# Patient Record
Sex: Female | Born: 1975 | Race: White | Hispanic: Yes | Smoking: Never smoker
Health system: Southern US, Community
[De-identification: ages and names within clinical notes are randomized; demographics above are authoritative.]

## PROBLEM LIST (undated history)

## (undated) HISTORY — PX: IMPACTED THIRD MOLAR REMOVAL: SHX1790

---

## 1997-10-30 ENCOUNTER — Emergency Department (HOSPITAL_COMMUNITY): Admission: EM | Admit: 1997-10-30 | Discharge: 1997-10-30 | Payer: Self-pay | Admitting: Emergency Medicine

## 1997-10-31 ENCOUNTER — Emergency Department (HOSPITAL_COMMUNITY): Admission: EM | Admit: 1997-10-31 | Discharge: 1997-10-31 | Payer: Self-pay | Admitting: *Deleted

## 1997-10-31 ENCOUNTER — Inpatient Hospital Stay (HOSPITAL_COMMUNITY): Admission: AD | Admit: 1997-10-31 | Discharge: 1997-11-03 | Payer: Self-pay | Admitting: Obstetrics

## 1997-11-08 ENCOUNTER — Encounter: Admission: RE | Admit: 1997-11-08 | Discharge: 1998-02-06 | Payer: Self-pay | Admitting: Obstetrics

## 1997-12-06 ENCOUNTER — Ambulatory Visit (HOSPITAL_COMMUNITY): Admission: RE | Admit: 1997-12-06 | Discharge: 1997-12-06 | Payer: Self-pay | Admitting: Obstetrics

## 1998-02-13 ENCOUNTER — Encounter: Admission: RE | Admit: 1998-02-13 | Discharge: 1998-05-14 | Payer: Self-pay | Admitting: Obstetrics & Gynecology

## 1998-02-27 ENCOUNTER — Ambulatory Visit (HOSPITAL_COMMUNITY): Admission: RE | Admit: 1998-02-27 | Discharge: 1998-02-27 | Payer: Self-pay | Admitting: Obstetrics & Gynecology

## 1998-04-17 ENCOUNTER — Encounter: Admission: RE | Admit: 1998-04-17 | Discharge: 1998-04-17 | Payer: Self-pay | Admitting: Obstetrics & Gynecology

## 1998-04-24 ENCOUNTER — Encounter: Admission: RE | Admit: 1998-04-24 | Discharge: 1998-04-24 | Payer: Self-pay | Admitting: Obstetrics & Gynecology

## 1998-05-03 ENCOUNTER — Inpatient Hospital Stay (HOSPITAL_COMMUNITY): Admission: AD | Admit: 1998-05-03 | Discharge: 1998-05-05 | Payer: Self-pay | Admitting: *Deleted

## 1998-07-14 ENCOUNTER — Emergency Department (HOSPITAL_COMMUNITY): Admission: EM | Admit: 1998-07-14 | Discharge: 1998-07-14 | Payer: Self-pay | Admitting: Emergency Medicine

## 1998-07-14 ENCOUNTER — Encounter: Payer: Self-pay | Admitting: Emergency Medicine

## 1998-11-15 ENCOUNTER — Other Ambulatory Visit: Admission: RE | Admit: 1998-11-15 | Discharge: 1998-11-15 | Payer: Self-pay | Admitting: Obstetrics

## 1998-11-18 ENCOUNTER — Other Ambulatory Visit: Admission: RE | Admit: 1998-11-18 | Discharge: 1998-11-18 | Payer: Self-pay | Admitting: Obstetrics

## 2002-08-13 ENCOUNTER — Inpatient Hospital Stay (HOSPITAL_COMMUNITY): Admission: AD | Admit: 2002-08-13 | Discharge: 2002-08-15 | Payer: Self-pay | Admitting: Obstetrics

## 2006-04-22 ENCOUNTER — Ambulatory Visit (HOSPITAL_COMMUNITY): Admission: RE | Admit: 2006-04-22 | Discharge: 2006-04-22 | Payer: Self-pay | Admitting: Chiropractic Medicine

## 2007-11-29 ENCOUNTER — Inpatient Hospital Stay (HOSPITAL_COMMUNITY): Admission: AD | Admit: 2007-11-29 | Discharge: 2007-11-29 | Payer: Self-pay | Admitting: Obstetrics & Gynecology

## 2007-12-17 ENCOUNTER — Inpatient Hospital Stay (HOSPITAL_COMMUNITY): Admission: AD | Admit: 2007-12-17 | Discharge: 2007-12-17 | Payer: Self-pay | Admitting: Obstetrics & Gynecology

## 2008-04-16 ENCOUNTER — Inpatient Hospital Stay (HOSPITAL_COMMUNITY): Admission: AD | Admit: 2008-04-16 | Discharge: 2008-04-17 | Payer: Self-pay | Admitting: Family Medicine

## 2008-04-27 ENCOUNTER — Inpatient Hospital Stay (HOSPITAL_COMMUNITY): Admission: AD | Admit: 2008-04-27 | Discharge: 2008-04-27 | Payer: Self-pay | Admitting: Obstetrics & Gynecology

## 2008-05-09 ENCOUNTER — Ambulatory Visit: Payer: Self-pay | Admitting: Obstetrics & Gynecology

## 2008-05-09 ENCOUNTER — Encounter: Payer: Self-pay | Admitting: Physician Assistant

## 2008-05-09 ENCOUNTER — Encounter: Payer: Self-pay | Admitting: Obstetrics and Gynecology

## 2008-05-09 LAB — CONVERTED CEMR LAB
Hgb A2 Quant: 2.5 % (ref 2.2–3.2)
Hgb A: 97.5 % (ref 96.8–97.8)
Hgb S Quant: 0 % (ref 0.0–0.0)

## 2008-05-18 ENCOUNTER — Ambulatory Visit: Payer: Self-pay | Admitting: Physician Assistant

## 2008-05-18 ENCOUNTER — Inpatient Hospital Stay (HOSPITAL_COMMUNITY): Admission: AD | Admit: 2008-05-18 | Discharge: 2008-05-18 | Payer: Self-pay | Admitting: Family Medicine

## 2008-05-21 ENCOUNTER — Inpatient Hospital Stay (HOSPITAL_COMMUNITY): Admission: AD | Admit: 2008-05-21 | Discharge: 2008-05-23 | Payer: Self-pay | Admitting: Family Medicine

## 2008-05-21 ENCOUNTER — Ambulatory Visit: Payer: Self-pay | Admitting: Obstetrics and Gynecology

## 2008-10-30 ENCOUNTER — Emergency Department (HOSPITAL_COMMUNITY): Admission: EM | Admit: 2008-10-30 | Discharge: 2008-10-30 | Payer: Self-pay | Admitting: Emergency Medicine

## 2010-10-15 LAB — POCT PREGNANCY, URINE: Preg Test, Ur: NEGATIVE

## 2010-10-15 LAB — POCT CARDIAC MARKERS
CKMB, poc: <1 ng/mL — ABNORMAL LOW (ref 1.0–8.0)
Troponin i, poc: <0.05 ng/mL (ref 0.00–0.09)

## 2010-10-15 LAB — HEPATIC FUNCTION PANEL
Albumin: 3.9 g/dL (ref 3.5–5.2)
Alkaline Phosphatase: 130 U/L — ABNORMAL HIGH (ref 39–117)
Bilirubin, Direct: <0.1 mg/dL (ref 0.0–0.3)

## 2010-10-15 LAB — POCT I-STAT, CHEM 8
BUN: 7 mg/dL (ref 6–23)
Calcium, Ion: 1.18 mmol/L (ref 1.12–1.32)
Chloride: 105 mEq/L (ref 96–112)
Glucose, Bld: 111 mg/dL — ABNORMAL HIGH (ref 70–99)
HCT: 45 % (ref 36.0–46.0)
Potassium: 3.5 mEq/L (ref 3.5–5.1)
Sodium: 140 mEq/L (ref 135–145)

## 2010-10-15 LAB — URINALYSIS, ROUTINE W REFLEX MICROSCOPIC: Bilirubin Urine: NEGATIVE

## 2010-10-15 LAB — D-DIMER, QUANTITATIVE: D-Dimer, Quant: <0.22 ug/mL-FEU (ref 0.00–0.48)

## 2010-10-15 LAB — URINE MICROSCOPIC-ADD ON

## 2011-04-01 LAB — URINALYSIS, ROUTINE W REFLEX MICROSCOPIC
Bilirubin Urine: NEGATIVE
Hgb urine dipstick: NEGATIVE
Ketones, ur: NEGATIVE
Protein, ur: NEGATIVE
Specific Gravity, Urine: <1.005 — ABNORMAL LOW
pH: 6

## 2011-04-01 LAB — WET PREP, GENITAL
Trich, Wet Prep: NONE SEEN
Yeast Wet Prep HPF POC: NONE SEEN

## 2011-04-01 LAB — GC/CHLAMYDIA PROBE AMP, GENITAL: Chlamydia, DNA Probe: NEGATIVE

## 2011-04-02 LAB — URINALYSIS, ROUTINE W REFLEX MICROSCOPIC
Specific Gravity, Urine: 1.01
Urobilinogen, UA: 0.2

## 2011-04-06 LAB — CBC
Hemoglobin: 11.8 — ABNORMAL LOW
MCV: 87.4
Platelets: 161
WBC: 10.4

## 2011-04-06 LAB — TYPE AND SCREEN: ABO/RH(D): O POS

## 2011-04-06 LAB — DIFFERENTIAL
Basophils Absolute: 0
Basophils Relative: 0
Eosinophils Absolute: 0.1
Eosinophils Relative: 1
Monocytes Absolute: 0.7
Monocytes Relative: 6
Neutro Abs: 7.5
Neutrophils Relative %: 73

## 2011-04-06 LAB — ABO/RH: ABO/RH(D): O POS

## 2011-04-06 LAB — HEPATITIS B SURFACE ANTIGEN: Hepatitis B Surface Ag: NEGATIVE

## 2011-04-06 LAB — RUBELLA SCREEN: Rubella: 168.9 — ABNORMAL HIGH

## 2011-04-07 LAB — CBC
HCT: 38.3
Platelets: 148 — ABNORMAL LOW
RBC: 3.52 — ABNORMAL LOW
RDW: 15
WBC: 13.4 — ABNORMAL HIGH
WBC: 13.9 — ABNORMAL HIGH

## 2011-04-07 LAB — POCT URINALYSIS DIP (DEVICE)
Hgb urine dipstick: NEGATIVE
Protein, ur: NEGATIVE
Specific Gravity, Urine: 1.01
Urobilinogen, UA: 0.2
pH: 5

## 2011-04-07 LAB — RAPID HIV SCREEN (WH-MAU): Rapid HIV Screen: NONREACTIVE

## 2011-04-07 LAB — GLUCOSE, CAPILLARY: Glucose-Capillary: 113 — ABNORMAL HIGH

## 2011-08-27 ENCOUNTER — Emergency Department (HOSPITAL_COMMUNITY)
Admission: EM | Admit: 2011-08-27 | Discharge: 2011-08-27 | Disposition: A | Discharge status: Home / Self Care | Payer: Self-pay | Attending: Emergency Medicine | Admitting: Emergency Medicine

## 2011-08-27 ENCOUNTER — Encounter (HOSPITAL_COMMUNITY): Payer: Self-pay | Admitting: Emergency Medicine

## 2011-08-27 DIAGNOSIS — B349 Viral infection, unspecified: Secondary | ICD-10-CM

## 2011-08-27 DIAGNOSIS — B9789 Other viral agents as the cause of diseases classified elsewhere: Secondary | ICD-10-CM | POA: Insufficient documentation

## 2011-08-27 DIAGNOSIS — R109 Unspecified abdominal pain: Secondary | ICD-10-CM | POA: Insufficient documentation

## 2011-08-27 DIAGNOSIS — R197 Diarrhea, unspecified: Secondary | ICD-10-CM | POA: Insufficient documentation

## 2011-08-27 DIAGNOSIS — R112 Nausea with vomiting, unspecified: Secondary | ICD-10-CM | POA: Insufficient documentation

## 2011-08-27 LAB — CBC
HCT: 39.5 % (ref 36.0–46.0)
Hemoglobin: 14 g/dL (ref 12.0–15.0)
MCV: 86.2 fL (ref 78.0–100.0)
RBC: 4.58 MIL/uL (ref 3.87–5.11)
WBC: 13.7 10*3/uL — ABNORMAL HIGH (ref 4.0–10.5)

## 2011-08-27 LAB — LIPASE, BLOOD: Lipase: 22 U/L (ref 11–59)

## 2011-08-27 LAB — URINALYSIS, ROUTINE W REFLEX MICROSCOPIC
Bilirubin Urine: NEGATIVE
Glucose, UA: NEGATIVE mg/dL
Ketones, ur: NEGATIVE mg/dL
Leukocytes, UA: NEGATIVE
Protein, ur: NEGATIVE mg/dL
pH: 5.5 (ref 5.0–8.0)

## 2011-08-27 LAB — COMPREHENSIVE METABOLIC PANEL
AST: 36 U/L (ref 0–37)
Albumin: 4.3 g/dL (ref 3.5–5.2)
Alkaline Phosphatase: 115 U/L (ref 39–117)
BUN: 14 mg/dL (ref 6–23)
CO2: 23 mEq/L (ref 19–32)
Chloride: 100 mEq/L (ref 96–112)
Creatinine, Ser: 0.53 mg/dL (ref 0.50–1.10)
GFR calc non Af Amer: >90 mL/min (ref >90)
Potassium: 3.1 mEq/L — ABNORMAL LOW (ref 3.5–5.1)
Total Bilirubin: 0.2 mg/dL — ABNORMAL LOW (ref 0.3–1.2)

## 2011-08-27 LAB — DIFFERENTIAL
Eosinophils Relative: 0 % (ref 0–5)
Lymphocytes Relative: 7 % — ABNORMAL LOW (ref 12–46)
Lymphs Abs: 1 10*3/uL (ref 0.7–4.0)
Monocytes Relative: 6 % (ref 3–12)
Neutro Abs: 11.9 10*3/uL — ABNORMAL HIGH (ref 1.7–7.7)

## 2011-08-27 MED ORDER — ONDANSETRON HCL 4 MG/2ML IJ SOLN
4.0000 mg | Freq: Once | INTRAMUSCULAR | Status: AC
  Administered 2011-08-27: 4 mg via INTRAVENOUS
  Filled 2011-08-27: qty 2

## 2011-08-27 MED ORDER — POTASSIUM CHLORIDE CRYS ER 20 MEQ PO TBCR
40.0000 meq | EXTENDED_RELEASE_TABLET | Freq: Once | ORAL | Status: AC
  Administered 2011-08-27: 40 meq via ORAL
  Filled 2011-08-27: qty 4

## 2011-08-27 MED ORDER — KETOROLAC TROMETHAMINE 30 MG/ML IJ SOLN
30.0000 mg | Freq: Once | INTRAMUSCULAR | Status: AC
  Administered 2011-08-27: 30 mg via INTRAVENOUS
  Filled 2011-08-27: qty 1

## 2011-08-27 MED ORDER — ONDANSETRON 8 MG PO TBDP: ORAL_TABLET | ORAL | Status: AC

## 2011-08-27 MED ORDER — PANTOPRAZOLE SODIUM 40 MG IV SOLR
40.0000 mg | Freq: Once | INTRAVENOUS | Status: AC
  Administered 2011-08-27: 40 mg via INTRAVENOUS
  Filled 2011-08-27: qty 40

## 2011-08-27 MED ORDER — SODIUM CHLORIDE 0.9 % IV BOLUS (SEPSIS)
1000.0000 mL | Freq: Once | INTRAVENOUS | Status: AC
  Administered 2011-08-27: 1000 mL via INTRAVENOUS

## 2011-08-27 NOTE — ED Provider Notes (Signed)
Medical screening examination/treatment/procedure(s) were performed by non-physician practitioner and as supervising physician I was immediately available for consultation/collaboration.   Dayton Bailiff, MD 08/27/11 [phone]

## 2011-08-27 NOTE — ED Provider Notes (Signed)
History     CSN: [id number]  Arrival date & time 08/27/11  0223   First MD Initiated Contact with Patient 08/27/11 0242      Chief Complaint  Patient presents with  . Abdominal Pain  . Diarrhea  . Nausea     HPI  History provided by the patient and son. Patient is a 36 year old Hispanic female with limited English with no significant past medical history who presents with complaints of acute onset of nausea vomiting diarrhea and abdominal cramping last evening. Symptoms began around 9 PM shortly after eating "[place]" food. Patient has had multiple episodes of vomiting and diarrhea. Diarrhea is soft watery stool without blood or mucus. Symptoms were followed by abdominal cramping and pain. Patient denies having any associated fever, chills, sweats. Patient denies any dysuria, hematuria, urinary frequency. Patient does not report having any known sick contacts.    History reviewed. No pertinent past medical history.  History reviewed. No pertinent past surgical history.  No family history on file.  History  Substance Use Topics  . Smoking status: Never Smoker   . Smokeless tobacco: Not on file  . Alcohol Use: No    OB History    Grav Para Term Preterm Abortions TAB SAB Ect Mult Living                  Review of Systems  Constitutional: Negative for fever and chills.  Respiratory: Negative for cough and shortness of breath.   Cardiovascular: Negative for chest pain.  Gastrointestinal: Positive for nausea, vomiting, abdominal pain and diarrhea. Negative for constipation.  Genitourinary: Negative for dysuria, frequency, hematuria, flank pain, vaginal bleeding and vaginal discharge.  All other systems reviewed and are negative.    Allergies  Review of patient's allergies indicates no known allergies.  Home Medications  No current outpatient prescriptions on file.  BP 127/85  Pulse 109  Temp(Src) 98.5 F (36.9 C) (Oral)  Resp 18  Ht 5\' 2"  (1.575 m)  Wt 173 lb  1.6 oz (78.518 kg)  BMI 31.66 kg/m2  SpO2 97%  Physical Exam  Nursing note and vitals reviewed. Constitutional: She is oriented to person, place, and time. She appears well-developed and well-nourished. No distress.  HENT:  Head: Normocephalic and atraumatic.  Mouth/Throat: Oropharynx is clear and moist.  Neck: Normal range of motion. Neck supple.       No meningeal signs  Cardiovascular: Normal rate and regular rhythm.   Pulmonary/Chest: Effort normal and breath sounds normal. No respiratory distress. She has no wheezes. She has no rales.  Abdominal: Soft. There is tenderness in the right upper quadrant, epigastric area and left lower quadrant. There is no rigidity, no rebound, no guarding, no CVA tenderness, no tenderness at McBurney's point and negative Murphy's sign.  Neurological: She is alert and oriented to person, place, and time.  Skin: Skin is warm and dry. No rash noted.  Psychiatric: She has a normal mood and affect. Her behavior is normal.    ED Course  Procedures   Results for orders placed during the hospital encounter of 08/27/11  CBC      Component Value Range   WBC 13.7 (*) 4.0 - 10.5 (K/uL)   RBC 4.58  3.87 - 5.11 (MIL/uL)   Hemoglobin 14.0  12.0 - 15.0 (g/dL)   HCT 40.9  81.1 - 91.4 (%)   MCV 86.2  78.0 - 100.0 (fL)   MCH 30.6  26.0 - 34.0 (pg)   MCHC 35.4  30.0 -  36.0 (g/dL)   RDW 16.1  09.6 - 04.5 (%)   Platelets 213  150 - 400 (K/uL)  DIFFERENTIAL      Component Value Range   Neutrophils Relative 87 (*) 43 - 77 (%)   Lymphocytes Relative 7 (*) 12 - 46 (%)   Monocytes Relative 6  3 - 12 (%)   Eosinophils Relative 0  0 - 5 (%)   Basophils Relative 0  0 - 1 (%)   Neutro Abs 11.9 (*) 1.7 - 7.7 (K/uL)   Lymphs Abs 1.0  0.7 - 4.0 (K/uL)   Monocytes Absolute 0.8  0.1 - 1.0 (K/uL)   Eosinophils Absolute 0.0  0.0 - 0.7 (K/uL)   Basophils Absolute 0.0  0.0 - 0.1 (K/uL)   Smear Review MORPHOLOGY UNREMARKABLE    COMPREHENSIVE METABOLIC PANEL      Component  Value Range   Sodium 134 (*) 135 - 145 (mEq/L)   Potassium 3.1 (*) 3.5 - 5.1 (mEq/L)   Chloride 100  96 - 112 (mEq/L)   CO2 23  19 - 32 (mEq/L)   Glucose, Bld 137 (*) 70 - 99 (mg/dL)   BUN 14  6 - 23 (mg/dL)   Creatinine, Ser 4.09  0.50 - 1.10 (mg/dL)   Calcium 9.3  8.4 - 81.1 (mg/dL)   Total Protein 8.0  6.0 - 8.3 (g/dL)   Albumin 4.3  3.5 - 5.2 (g/dL)   AST 36  0 - 37 (U/L)   ALT 67 (*) 0 - 35 (U/L)   Alkaline Phosphatase 115  39 - 117 (U/L)   Total Bilirubin 0.2 (*) 0.3 - 1.2 (mg/dL)   GFR calc non Af Amer >90  >90 (mL/min)   GFR calc Af Amer >90  >90 (mL/min)  LIPASE, BLOOD      Component Value Range   Lipase 22  11 - 59 (U/L)  URINALYSIS, ROUTINE W REFLEX MICROSCOPIC      Component Value Range   Color, Urine YELLOW  YELLOW    APPearance CLEAR  CLEAR    Specific Gravity, Urine 1.027  1.005 - 1.030    pH 5.5  5.0 - 8.0    Glucose, UA NEGATIVE  NEGATIVE (mg/dL)   Hgb urine dipstick NEGATIVE  NEGATIVE    Bilirubin Urine NEGATIVE  NEGATIVE    Ketones, ur NEGATIVE  NEGATIVE (mg/dL)   Protein, ur NEGATIVE  NEGATIVE (mg/dL)   Urobilinogen, UA 0.2  0.0 - 1.0 (mg/dL)   Nitrite NEGATIVE  NEGATIVE    Leukocytes, UA NEGATIVE  NEGATIVE       1. Nausea vomiting and diarrhea   2. Viral syndrome       MDM  2:35 AM patient seen and evaluated. Patient no acute distress.   4:10 AM patient reports having significant improvement of pain symptoms. Reexam of the abdomen is soft with no focal tenderness. No Murphy sign, no rebound no guarding.  Patient discussed with attending physician. Patient with benign abdominal exam. Labs with slight elevated WBC otherwise unremarkable. Symptoms are consistent with viral GI process. Patient has improved after Zofran and IV fluids. At this time we'll discharge with prescription for Zofran.   Angus Seller, [place] 08/27/11 [phone]

## 2011-08-27 NOTE — Discharge Instructions (Signed)
You were seen and evaluated today for your symptoms of nausea vomiting diarrhea and abdominal pains. At this time your providers feel your symptoms are most likely cause a viral infection. It is important to drink plenty of fluids to stay hydrated. Get plenty of rest. Use Tylenol or iron profile for aches and pains or any fever. If you develop any worsening symptoms, increased and persistent abdominal pain, persistent nausea and vomiting please return to the emergency room.  Nuseas y Vmitos (Nausea and Vomiting) La nusea es la sensacin de Dentist en el estmago o de la necesidad de vomitar. El vmito es un reflejo por el que los contenidos del estmago salen por la boca. El vmito puede ocasionar prdida de lquidos del organismo (deshidratacin). Los nios y los [employer] pueden deshidratarse rpidamente (en especial si tambin tienen diarrea). Las nuseas y los vmitos son sntoma de un trastorno o enfermedad. Es importante Emergency planning/management officer causa de los sntomas. CAUSAS  Irritacin directa de la membrana que cubre el [place]. Esta irritacin puede ser resultado del aumento de la produccin de cido, (reflujo gastroesofgico), infecciones, intoxicacin alimentaria, ciertos medicamentos (como antinflamatorios no esteroideos), consumo de alcohol o de tabaco.   Seales del cerebro.Estas seales pueden ser un dolor de cabeza, exposicin al calor, trastornos del odo interno, aumento de la presin en el cerebro por lesiones, infeccin, un tumor o conmocin cerebral, estmulos emocionales o problemas metablicos.   Una obstruccin en el tracto gastrointestinal (obstruccin intestinal).   Ciertas enfermedades como la diabetes, problemas en la vescula biliar, apendicitis, problemas renales, cncer, sepsis, sntomas atpicos de infarto o trastornos alimentarios.   Tratamientos mdicos como la quimioterapia y la radiacin.   Medicamentos que inducen al sueo (anestesia general)durante [place].    DIAGNSTICO  El mdico podr solicitarle algunos anlisis si los problemas no mejoran luego de [address]. Tambin podrn pedirle anlisis si los sntomas son graves o si el motivo de los vmitos o las nuseas no est claro. Los [employer] ser:   Anlisis de [place]   Anlisis de [place].   Pruebas de materia fecal.   Cultivos (para buscar evidencias de infeccin).   Radiografas u otros estudios por imgenes.  Los [employer] de las pruebas lo ayudarn al mdico a tomar decisiones acerca del mejor curso de tratamiento o la necesidad de [employer].  TRATAMIENTO  Debe estar bien hidratado. Beba con frecuencia pequeas cantidades de lquido.Puede beber agua, bebidas deportivas, caldos claros o comer pequeos trocitos de hielo o gelatina para mantenerse hidratado.Cuando coma, hgalo lentamente para evitar las nuseas.Hay medicamentos para evitar las nuseas que pueden aliviarlo.  INSTRUCCIONES PARA EL CUIDADO DOMICILIARIO  Si su mdico le prescribe medicamentos tmelos como se le haya indicado.   Si no tiene hambre, no se fuerce a comer. Sin embargo, es necesario que tome lquidos.   Si tiene hambre alimntese con una dieta normal, a menos que el mdico le indique otra cosa.   Los mejores alimentos son Neomia Dear combinacin de carbohidratos complejos (arroz, trigo, papas, pan), carnes magras, yogur, frutas y Sports administrator.   Evite los alimentos ricos en grasas porque dificultan la digestin.   Beba gran cantidad de lquido para mantener la orina de tono claro o color amarillo plido.   Si est deshidratado, consulte a su mdico para que le d instrucciones especficas para volver a hidratarlo Los signos de deshidratacin son:   Franz Dell sed.   Labios y boca secos.   Mareos.   [place].   Disminucin de la frecuencia  y cantidad de la [place].   Confusin.   Tiene el pulso o la respiracin acelerados.  SOLICITE ATENCIN MDICA DE INMEDIATO SI:  Vomita sangre o algo  similar a la borra del caf.   La materia fecal (heces)es negra o tiene [place].   Sufre una cefalea grave o rigidez en el cuello.   Se siente confundido.   Siente dolor abdominal intenso.   Tiene dolor en el pecho o dificultad para respirar.   No orina por 8 horas.   Tiene la piel fra y pegajosa.   Sigue vomitando durante ms de 24 a 48 horas.   Tiene fiebre.  ASEGRESE QUE:   Comprende estas instrucciones.   Controlar su enfermedad.   Solicitar ayuda inmediatamente si no mejora o si empeora.  Document Released: 07/12/2007 Document Revised: 03/04/2011 [facility] Patient Information 2012 [place], [place].   Sndrome viral (Viral Syndrome) Usted o su hijo padecen un sndrome viral. Es la infeccin ms frecuente que causa "resfros" e infecciones en la nariz, garganta, senos paranasales y vias respiratorias. En algunos casos la infeccin ocasiona nuseas o diarrea. El germen que causa este tipo de infecciones es un virus. Ningn antibitico ni otros medicamentos lo destruirn. Hay medicamentos que usted o su nio podrn tomar para sentirse ms confortables.  INSTRUCCIONES PARA EL CUIDADO DOMICILIARIO  Haga reposo en la cama hasta que comience a sentirse bien.   Si tiene diarrea o vmitos, coma pequeas cantidades de crackers o tostadas. Una sopa puede hacerle bien.   NO administre a los nios aspirina, ni ningn otro medicamento que la [place].   Slo tome medicamentos de Sales promotion account executive o prescriptos para Primary school teacher, las [place], o bajar la fiebre segn las indicaciones de su mdico. Tome el ibuprofeno con el estmago lleno o con las comidas para Automotive engineer los trastornos estomacales.  SOLICITE ATENCIN MDICA DE INMEDIATO SI:  Usted o su nio no mejoran en el lapso de [employer].   Usted o su nio sienten un dolor que no se Chief Executive Officer con los medicamentos de [place].   Escupe moco espeso coloreado o sanguinolento.   La secrecin nasal se vuelve espesa y amarilla o  verde.   La diarrea o los vmitos empeoran.   Observa algn cambio importante en la enfermedad de su nio.   Usted o el nio presentan una erupcin en la piel, rigidez en el cuello, dolor de cabeza intenso o no pueden retener alimentos o lquidos.   Usted o su nio tienen una temperatura oral de ms de 102 F (38.9 C) y no puede controlarla con medicamentos.   Su beb tiene ms de 3 meses y su temperatura rectal es de 102 F (38.9 C) o ms.   Su beb tiene 3 meses o menos y su temperatura rectal es de 100.4 F (38 C) o ms.  Document Released: 10/08/2008 Document Revised: 03/04/2011 [facility] Patient Information 2012 [place], [place].

## 2011-08-27 NOTE — ED Notes (Signed)
Pt alert, nad, c/o gen abd pain, onset this evening after pt started having diarrhea, resp even unlabored, skin pwd

## 2013-09-20 ENCOUNTER — Ambulatory Visit: Payer: Self-pay

## 2014-11-01 ENCOUNTER — Emergency Department (HOSPITAL_COMMUNITY)
Admission: EM | Admit: 2014-11-01 | Discharge: 2014-11-01 | Disposition: A | Discharge status: Home / Self Care | Payer: Self-pay | Attending: Emergency Medicine | Admitting: Emergency Medicine

## 2014-11-01 ENCOUNTER — Encounter (HOSPITAL_COMMUNITY): Payer: Self-pay | Admitting: Emergency Medicine

## 2014-11-01 DIAGNOSIS — R05 Cough: Secondary | ICD-10-CM | POA: Insufficient documentation

## 2014-11-01 DIAGNOSIS — R0602 Shortness of breath: Secondary | ICD-10-CM | POA: Insufficient documentation

## 2014-11-01 DIAGNOSIS — R0600 Dyspnea, unspecified: Secondary | ICD-10-CM | POA: Insufficient documentation

## 2014-11-01 DIAGNOSIS — R062 Wheezing: Secondary | ICD-10-CM | POA: Insufficient documentation

## 2014-11-01 DIAGNOSIS — R0982 Postnasal drip: Secondary | ICD-10-CM | POA: Insufficient documentation

## 2014-11-01 MED ORDER — PREDNISONE 20 MG PO TABS: 40.0000 mg | ORAL_TABLET | Freq: Every day | ORAL | Status: AC

## 2014-11-01 MED ORDER — PSEUDOEPHEDRINE HCL 60 MG PO TABS
30.0000 mg | ORAL_TABLET | Freq: Once | ORAL | Status: AC
  Administered 2014-11-01: 30 mg via ORAL
  Filled 2014-11-01: qty 1

## 2014-11-01 MED ORDER — ALBUTEROL SULFATE (2.5 MG/3ML) 0.083% IN NEBU: 2.5000 mg | INHALATION_SOLUTION | RESPIRATORY_TRACT | Status: DC

## 2014-11-01 MED ORDER — ALBUTEROL SULFATE (2.5 MG/3ML) 0.083% IN NEBU
5.0000 mg | INHALATION_SOLUTION | Freq: Once | RESPIRATORY_TRACT | Status: AC
Start: 2014-11-01 — End: 2014-11-01
  Administered 2014-11-01: 5 mg via RESPIRATORY_TRACT
  Filled 2014-11-01: qty 6

## 2014-11-01 MED ORDER — PREDNISONE 20 MG PO TABS
60.0000 mg | ORAL_TABLET | Freq: Once | ORAL | Status: AC
  Administered 2014-11-01: 60 mg via ORAL
  Filled 2014-11-01: qty 3

## 2014-11-01 MED ORDER — PSEUDOEPHEDRINE HCL 30 MG PO TABS: 30.0000 mg | ORAL_TABLET | Freq: Two times a day (BID) | ORAL | Status: DC

## 2014-11-01 NOTE — ED Notes (Signed)
Pt states that she has been having nasal congestion and cough since Tuesday.  States this happens every year.

## 2014-11-01 NOTE — ED Provider Notes (Signed)
CSN: [id number]     Arrival date & time 11/01/14  [id number] History   First MD Initiated Contact with Patient 11/01/14 1000     Chief Complaint  Patient presents with  . Cough  . Nasal Congestion     (Consider location/radiation/quality/duration/timing/severity/associated sxs/prior Treatment) HPI Patient presents with concern of rhinorrhea, cough, mild dyspnea. Symptoms have been present for several days, worsening as the patient has had no access to albuterol nebulizer. Patient is generally well aside from reactive airway disease. No ongoing fever, chest pain, belly pain, vomiting. Patient states that she is typical episodes every spring.  History reviewed. No pertinent past medical history. History reviewed. No pertinent past surgical history. History reviewed. No pertinent family history. History  Substance Use Topics  . Smoking status: Never Smoker   . Smokeless tobacco: Not on file  . Alcohol Use: No   OB History    No data available     Review of Systems  Constitutional: Negative for fever.  HENT: Positive for postnasal drip and rhinorrhea.   Eyes: Positive for discharge.  Respiratory: Positive for shortness of breath.   Cardiovascular: Negative for chest pain.  Gastrointestinal: Negative for nausea and vomiting.  Skin: Negative for rash.  Allergic/Immunologic: Negative for immunocompromised state.  Neurological: Negative for headaches.      Allergies  Review of patient's allergies indicates no known allergies.  Home Medications   Prior to Admission medications   Medication Sig Start Date End Date Taking? Authorizing Provider  albuterol (PROVENTIL) (2.5 MG/3ML) 0.083% nebulizer solution Take 3 mLs (2.5 mg total) by nebulization every 4 (four) hours. 11/01/14 11/03/14  Gerhard Munchobert Renly Roots, MD  predniSONE (DELTASONE) 20 MG tablet Take 2 tablets (40 mg total) by mouth daily with breakfast. 11/02/14 11/05/14  Gerhard Munchobert Justyn Langham, MD  pseudoephedrine (SUDAFED) 30 MG tablet Take 1  tablet (30 mg total) by mouth 2 (two) times daily. 11/01/14   Gerhard Munchobert Manahil Vanzile, MD   BP 123/98 mmHg  Pulse 76  Temp(Src) 98.1 F (36.7 C) (Oral)  Resp 18  SpO2 100% Physical Exam  Constitutional: She is oriented to person, place, and time. She appears well-developed and well-nourished. No distress.  HENT:  Head: Normocephalic and atraumatic.  Eyes: Conjunctivae and EOM are normal.  Cardiovascular: Normal rate and regular rhythm.   Pulmonary/Chest: Effort normal. No stridor. No respiratory distress. She has wheezes.  Abdominal: She exhibits no distension.  Musculoskeletal: She exhibits no edema.  Neurological: She is alert and oriented to person, place, and time. No cranial nerve deficit.  Skin: Skin is warm and dry.  Psychiatric: She has a normal mood and affect.  Nursing note and vitals reviewed.   ED Course  Procedures (including critical care time) Patient received one albuterol treatment in the emergency department with reduction in her wheezing.   MDM   Final diagnoses:  Dyspnea   patient presents with wheezing. Patient is afebrile, has breath sounds bilaterally, no evidence for pneumonia. Given the patient's history of reactive airway disease, suspicion for exacerbation, particularly given the patient's lack of access to appropriate medication. With improvement here she started on a course of steroids, bronchodilator, decongestant, provided follow-up instructions.  Gerhard Munchobert Nakyla Bracco, MD 11/01/14 1041

## 2014-11-01 NOTE — Discharge Instructions (Signed)
Please take all medication as directed, including:  Prednisone for four days (4/29 - 5/2)  Sudafed every 12 hours for four days  Albuterol every four hours for two days - then as needed.

## 2016-01-20 ENCOUNTER — Ambulatory Visit: Payer: Self-pay

## 2020-05-18 ENCOUNTER — Emergency Department (HOSPITAL_COMMUNITY): Payer: Self-pay

## 2020-05-18 ENCOUNTER — Other Ambulatory Visit: Payer: Self-pay

## 2020-05-18 ENCOUNTER — Encounter (HOSPITAL_COMMUNITY): Payer: Self-pay | Admitting: *Deleted

## 2020-05-18 ENCOUNTER — Inpatient Hospital Stay (HOSPITAL_COMMUNITY)
Admission: EM | Admit: 2020-05-18 | Discharge: 2020-05-21 | DRG: 419 | Disposition: A | Discharge status: Home / Self Care | Payer: Self-pay | Attending: General Surgery | Admitting: General Surgery

## 2020-05-18 DIAGNOSIS — K760 Fatty (change of) liver, not elsewhere classified: Secondary | ICD-10-CM | POA: Diagnosis present

## 2020-05-18 DIAGNOSIS — K8000 Calculus of gallbladder with acute cholecystitis without obstruction: Secondary | ICD-10-CM | POA: Diagnosis present

## 2020-05-18 DIAGNOSIS — Z20822 Contact with and (suspected) exposure to covid-19: Secondary | ICD-10-CM | POA: Diagnosis present

## 2020-05-18 DIAGNOSIS — R1011 Right upper quadrant pain: Secondary | ICD-10-CM

## 2020-05-18 DIAGNOSIS — Z6831 Body mass index (BMI) 31.0-31.9, adult: Secondary | ICD-10-CM

## 2020-05-18 DIAGNOSIS — K8012 Calculus of gallbladder with acute and chronic cholecystitis without obstruction: Principal | ICD-10-CM | POA: Diagnosis present

## 2020-05-18 DIAGNOSIS — K5730 Diverticulosis of large intestine without perforation or abscess without bleeding: Secondary | ICD-10-CM | POA: Diagnosis present

## 2020-05-18 DIAGNOSIS — K810 Acute cholecystitis: Secondary | ICD-10-CM

## 2020-05-18 LAB — URINALYSIS, ROUTINE W REFLEX MICROSCOPIC
Bacteria, UA: NONE SEEN
Bilirubin Urine: NEGATIVE
Glucose, UA: NEGATIVE mg/dL
Hgb urine dipstick: NEGATIVE
Ketones, ur: 5 mg/dL — AB
Nitrite: NEGATIVE
Protein, ur: NEGATIVE mg/dL
Specific Gravity, Urine: 1.014 (ref 1.005–1.030)
pH: 6 (ref 5.0–8.0)

## 2020-05-18 LAB — RESPIRATORY PANEL BY RT PCR (FLU A&B, COVID)
Influenza A by PCR: NEGATIVE
Influenza B by PCR: NEGATIVE
SARS Coronavirus 2 by RT PCR: NEGATIVE

## 2020-05-18 LAB — CBC
HCT: 44 % (ref 36.0–46.0)
Hemoglobin: 14.9 g/dL (ref 12.0–15.0)
MCH: 29.9 pg (ref 26.0–34.0)
MCHC: 33.9 g/dL (ref 30.0–36.0)
MCV: 88.2 fL (ref 80.0–100.0)
Platelets: 301 10*3/uL (ref 150–400)
RBC: 4.99 MIL/uL (ref 3.87–5.11)
RDW: 13 % (ref 11.5–15.5)
WBC: 18.2 10*3/uL — ABNORMAL HIGH (ref 4.0–10.5)
nRBC: 0 % (ref 0.0–0.2)

## 2020-05-18 LAB — COMPREHENSIVE METABOLIC PANEL
ALT: 41 U/L (ref 0–44)
AST: 57 U/L — ABNORMAL HIGH (ref 15–41)
Albumin: 4.2 g/dL (ref 3.5–5.0)
Alkaline Phosphatase: 119 U/L (ref 38–126)
Anion gap: 10 (ref 5–15)
BUN: 11 mg/dL (ref 6–20)
CO2: 24 mmol/L (ref 22–32)
Calcium: 9.3 mg/dL (ref 8.9–10.3)
Chloride: 101 mmol/L (ref 98–111)
Creatinine, Ser: 0.63 mg/dL (ref 0.44–1.00)
GFR, Estimated: >60 mL/min (ref >60)
Glucose, Bld: 144 mg/dL — ABNORMAL HIGH (ref 70–99)
Potassium: 4 mmol/L (ref 3.5–5.1)
Sodium: 135 mmol/L (ref 135–145)
Total Bilirubin: 0.7 mg/dL (ref 0.3–1.2)
Total Protein: 7.6 g/dL (ref 6.5–8.1)

## 2020-05-18 LAB — I-STAT BETA HCG BLOOD, ED (MC, WL, AP ONLY): I-stat hCG, quantitative: <5 m[IU]/mL (ref <5)

## 2020-05-18 LAB — LIPASE, BLOOD: Lipase: 31 U/L (ref 11–51)

## 2020-05-18 MED ORDER — OXYCODONE HCL 5 MG PO TABS
5.0000 mg | ORAL_TABLET | ORAL | Status: DC | PRN
  Administered 2020-05-19: 5 mg via ORAL
  Administered 2020-05-20 (×2): 10 mg via ORAL
  Filled 2020-05-18: qty 2
  Filled 2020-05-18: qty 1
  Filled 2020-05-18: qty 2

## 2020-05-18 MED ORDER — ONDANSETRON HCL 4 MG/2ML IJ SOLN
4.0000 mg | Freq: Four times a day (QID) | INTRAMUSCULAR | Status: DC | PRN
  Administered 2020-05-19: 4 mg via INTRAVENOUS
  Filled 2020-05-18: qty 2

## 2020-05-18 MED ORDER — KCL-LACTATED RINGERS-D5W 20 MEQ/L IV SOLN
INTRAVENOUS | Status: DC
  Filled 2020-05-18 (×5): qty 1000

## 2020-05-18 MED ORDER — DOCUSATE SODIUM 100 MG PO CAPS
100.0000 mg | ORAL_CAPSULE | Freq: Two times a day (BID) | ORAL | Status: DC
  Administered 2020-05-19 – 2020-05-21 (×4): 100 mg via ORAL
  Filled 2020-05-18 (×4): qty 1

## 2020-05-18 MED ORDER — KETOROLAC TROMETHAMINE 30 MG/ML IJ SOLN: 30.0000 mg | Freq: Four times a day (QID) | INTRAMUSCULAR | Status: DC | PRN

## 2020-05-18 MED ORDER — DIPHENHYDRAMINE HCL 50 MG/ML IJ SOLN: 12.5000 mg | Freq: Four times a day (QID) | INTRAMUSCULAR | Status: DC | PRN

## 2020-05-18 MED ORDER — MORPHINE SULFATE (PF) 2 MG/ML IV SOLN
2.0000 mg | Freq: Once | INTRAVENOUS | Status: AC
  Administered 2020-05-18: 2 mg via INTRAVENOUS
  Filled 2020-05-18: qty 1

## 2020-05-18 MED ORDER — HYDROMORPHONE HCL 1 MG/ML IJ SOLN
0.5000 mg | INTRAMUSCULAR | Status: DC | PRN
  Administered 2020-05-19: 1 mg via INTRAVENOUS
  Administered 2020-05-19: 0.5 mg via INTRAVENOUS
  Administered 2020-05-20 (×3): 1 mg via INTRAVENOUS
  Filled 2020-05-18 (×4): qty 1
  Filled 2020-05-18: qty 0.5

## 2020-05-18 MED ORDER — MORPHINE SULFATE (PF) 2 MG/ML IV SOLN
2.0000 mg | Freq: Once | INTRAVENOUS | Status: AC
Start: 2020-05-18 — End: 2020-05-18
  Administered 2020-05-18: 2 mg via INTRAVENOUS
  Filled 2020-05-18: qty 1

## 2020-05-18 MED ORDER — PROCHLORPERAZINE EDISYLATE 10 MG/2ML IJ SOLN: 5.0000 mg | Freq: Four times a day (QID) | INTRAMUSCULAR | Status: DC | PRN

## 2020-05-18 MED ORDER — PIPERACILLIN-TAZOBACTAM 3.375 G IVPB
3.3750 g | Freq: Three times a day (TID) | INTRAVENOUS | Status: DC
  Administered 2020-05-19 (×2): 3.375 g via INTRAVENOUS
  Filled 2020-05-18 (×2): qty 50

## 2020-05-18 MED ORDER — ONDANSETRON 4 MG PO TBDP: 4.0000 mg | ORAL_TABLET | Freq: Four times a day (QID) | ORAL | Status: DC | PRN

## 2020-05-18 MED ORDER — SODIUM CHLORIDE 0.9 % IV BOLUS
1000.0000 mL | Freq: Once | INTRAVENOUS | Status: AC
  Administered 2020-05-18: 1000 mL via INTRAVENOUS

## 2020-05-18 MED ORDER — ENOXAPARIN SODIUM 40 MG/0.4ML ~~LOC~~ SOLN: 40.0000 mg | SUBCUTANEOUS | Status: DC

## 2020-05-18 MED ORDER — KETOROLAC TROMETHAMINE 30 MG/ML IJ SOLN
30.0000 mg | Freq: Four times a day (QID) | INTRAMUSCULAR | Status: AC
  Administered 2020-05-19 (×2): 30 mg via INTRAVENOUS
  Filled 2020-05-18 (×2): qty 1

## 2020-05-18 MED ORDER — DIPHENHYDRAMINE HCL 12.5 MG/5ML PO ELIX: 12.5000 mg | ORAL_SOLUTION | Freq: Four times a day (QID) | ORAL | Status: DC | PRN

## 2020-05-18 MED ORDER — ZOLPIDEM TARTRATE 5 MG PO TABS: 5.0000 mg | ORAL_TABLET | Freq: Every evening | ORAL | Status: DC | PRN

## 2020-05-18 MED ORDER — SODIUM CHLORIDE 0.9 % IV SOLN
2.0000 g | Freq: Once | INTRAVENOUS | Status: AC
  Administered 2020-05-18: 2 g via INTRAVENOUS
  Filled 2020-05-18: qty 20

## 2020-05-18 MED ORDER — IOHEXOL 300 MG/ML  SOLN
100.0000 mL | Freq: Once | INTRAMUSCULAR | Status: AC | PRN
  Administered 2020-05-18: 100 mL via INTRAVENOUS

## 2020-05-18 MED ORDER — PROCHLORPERAZINE MALEATE 10 MG PO TABS
10.0000 mg | ORAL_TABLET | Freq: Four times a day (QID) | ORAL | Status: DC | PRN
  Filled 2020-05-18: qty 1

## 2020-05-18 NOTE — ED Notes (Signed)
Ambulated to bathroom independently with steady gait

## 2020-05-18 NOTE — ED Notes (Signed)
Patient transported to CT 

## 2020-05-18 NOTE — H&P (Signed)
Crystal Burton is an 44 y.o. female.   Chief Complaint: Abdominal pain HPI:  Pt is a 44 yo F who comes to the ED with around 24 hours of worsening right sided abdominal pain and severe n/v.  The pain started in the RLQ at work yesterday and gradually migrated to the upper central abdomen and RUQ.  She has not had any bloody emesis.  She has no significant PMH. She denies f/c.  She has not been jaundiced.  She has never felt pain this severe before, but has had similar pain that resolved on its own around 4 times in the past year.  Nothing she tried at home helped including resting and tylenol.  Her mother required gallbladder surgery.  No recent pregnancy. No prior abdominal surgery.   History reviewed. No pertinent past medical history.  History reviewed. No pertinent surgical history.  No family history on file. Social History:  reports that she has never smoked. She has never used smokeless tobacco. She reports that she does not drink alcohol. No history on file for drug use.  Allergies: No Known Allergies  Meds:  none  Results for orders placed or performed during the hospital encounter of 05/18/20 (from the past 48 hour(s))  I-Stat beta hCG blood, ED     Status: None   Collection Time: 05/18/20  3:19 PM  Result Value Ref Range   I-stat hCG, quantitative <5.0 <5 mIU/mL   Comment 3            Comment:   GEST. AGE      CONC.  (mIU/mL)   <=1 WEEK        5 - 50     2 WEEKS       50 - 500     3 WEEKS       100 - 10,000     4 WEEKS     1,000 - 30,000        FEMALE AND NON-PREGNANT FEMALE:     LESS THAN 5 mIU/mL   Comprehensive metabolic panel     Status: Abnormal   Collection Time: 05/18/20  3:57 PM  Result Value Ref Range   Sodium 135 135 - 145 mmol/L   Potassium 4.0 3.5 - 5.1 mmol/L   Chloride 101 98 - 111 mmol/L   CO2 24 22 - 32 mmol/L   Glucose, Bld 144 (H) 70 - 99 mg/dL    Comment: Glucose reference range applies only to samples taken after fasting for at  least 8 hours.   BUN 11 6 - 20 mg/dL   Creatinine, Ser 2.99 0.44 - 1.00 mg/dL   Calcium 9.3 8.9 - 24.2 mg/dL   Total Protein 7.6 6.5 - 8.1 g/dL   Albumin 4.2 3.5 - 5.0 g/dL   AST 57 (H) 15 - 41 U/L   ALT 41 0 - 44 U/L   Alkaline Phosphatase 119 38 - 126 U/L   Total Bilirubin 0.7 0.3 - 1.2 mg/dL   GFR, Estimated >68 >34 mL/min    Comment: (NOTE) Calculated using the CKD-EPI Creatinine Equation (2021)    Anion gap 10 5 - 15    Comment: Performed at [facility], [address], [place], [place] [address]  Lipase, blood     Status: None   Collection Time: 05/18/20  3:57 PM  Result Value Ref Range   Lipase 31 11 - 51 U/L    Comment: Performed at [facility], [address] [place], [place] [address]  CBC     Status: Abnormal   Collection Time: 05/18/20  3:57 PM  Result Value Ref Range   WBC 18.2 (H) 4.0 - 10.5 K/uL   RBC 4.99 3.87 - 5.11 MIL/uL   Hemoglobin 14.9 12.0 - 15.0 g/dL   HCT 85.4 36 - 46 %   MCV 88.2 80.0 - 100.0 fL   MCH 29.9 26.0 - 34.0 pg   MCHC 33.9 30.0 - 36.0 g/dL   RDW 62.7 03.5 - 00.9 %   Platelets 301 150 - 400 K/uL   nRBC 0.0 0.0 - 0.2 %    Comment: Performed at [facility], [address], [place], [place] [address]  Urinalysis, Routine w reflex microscopic Urine, Clean Catch     Status: Abnormal   Collection Time: 05/18/20  7:30 PM  Result Value Ref Range   Color, Urine STRAW (A) YELLOW   APPearance CLEAR CLEAR   Specific Gravity, Urine 1.014 1.005 - 1.030   pH 6.0 5.0 - 8.0   Glucose, UA NEGATIVE NEGATIVE mg/dL   Hgb urine dipstick NEGATIVE NEGATIVE   Bilirubin Urine NEGATIVE NEGATIVE   Ketones, ur 5 (A) NEGATIVE mg/dL   Protein, ur NEGATIVE NEGATIVE mg/dL   Nitrite NEGATIVE NEGATIVE   Leukocytes,Ua TRACE (A) NEGATIVE   RBC / HPF 0-5 0 - 5 RBC/hpf   WBC, UA 0-5 0 - 5 WBC/hpf   Bacteria, UA NONE SEEN NONE SEEN   Squamous Epithelial / LPF 0-5 0 - 5   Mucus PRESENT     Comment: Performed at [facility], [address] [place], [place] [address]   CT ABDOMEN PELVIS W CONTRAST  Result Date: 05/18/2020 CLINICAL DATA:  44 year old female with right upper quadrant abdominal pain. EXAM: CT ABDOMEN AND PELVIS WITH CONTRAST TECHNIQUE: Multidetector CT imaging of the abdomen and pelvis was performed using the standard protocol following bolus administration of intravenous contrast. CONTRAST:  OMNIPAQUE IOHEXOL 300 MG/ML  SOLN COMPARISON:  None. FINDINGS: Lower chest: The visualized lung bases are clear. No intra-abdominal free air or free fluid. Hepatobiliary: Fatty infiltration of the liver. No intrahepatic biliary ductal dilatation. There is a small stone within the gallbladder. There is mild thickened appearance of the gallbladder wall or small pericholecystic fluid. Further evaluation with right upper quadrant ultrasound recommended. Pancreas: Unremarkable. No pancreatic ductal dilatation or surrounding inflammatory changes. Spleen: Normal in size without focal abnormality. Adrenals/Urinary Tract: The adrenal glands unremarkable. The kidneys, visualized ureters, and urinary bladder appear unremarkable. Stomach/Bowel: Small scattered colonic diverticula without active inflammation. There is no bowel obstruction or active inflammation. The appendix is normal. Vascular/Lymphatic: The abdominal aorta and IVC are unremarkable. No portal venous gas. There is no adenopathy. Reproductive: The uterus is anteverted. Probable left uterine fibroid. No adnexal masses. Other: None Musculoskeletal: No acute or significant osseous findings. IMPRESSION: 1. Cholelithiasis with possible early or mild acute cholecystitis. Further evaluation with right upper quadrant ultrasound recommended. 2. Fatty liver. 3. Small scattered colonic diverticula. No bowel obstruction. Normal appendix. Electronically Signed   By: Elgie Collard M.D.   On: 05/18/2020 18:57   US Abdomen Limited RUQ (LIVER/GB)  Result Date: 05/18/2020 CLINICAL DATA:   44 year old female with right upper quadrant abdominal pain. EXAM: ULTRASOUND ABDOMEN LIMITED RIGHT UPPER QUADRANT COMPARISON:  CT abdomen pelvis dated 05/18/2020. FINDINGS: Evaluation is limited due to body habitus and overlying bowel gas. Gallbladder: There is a 3 cm stone in the gallbladder. The gallbladder wall  is slightly thickened measuring 5 mm. There is probable trace pericholecystic fluid. Positive sonographic Murphy's sign reported. Common bile duct: Diameter: 3 mm Liver: There is diffuse increased liver echogenicity most commonly seen in the setting of fatty infiltration. Superimposed inflammation or fibrosis is not excluded. Clinical correlation is recommended. Portal vein is patent on color Doppler imaging with normal direction of blood flow towards the liver. Other: None. IMPRESSION: 1. Cholelithiasis with sonographic findings of acute cholecystitis. 2. Fatty liver. Electronically Signed   By: Elgie Collard M.D.   On: 05/18/2020 20:03    Review of Systems  Constitutional: Negative.   HENT: Negative.   Eyes: Negative.   Respiratory: Negative.   Cardiovascular: Negative.   Gastrointestinal: Positive for abdominal pain, nausea and vomiting.  Endocrine: Negative.   Genitourinary: Negative.   Musculoskeletal: Negative.   Skin: Negative.   Allergic/Immunologic: Negative.   Neurological: Negative.   Hematological: Negative.   Psychiatric/Behavioral: Negative.   All other systems reviewed and are negative.   Blood pressure 138/81, pulse 75, resp. rate (!) 25, height 5\' 2"  (1.575 m), weight 78.5 kg, last menstrual period 05/14/2020, SpO2 99 %. Physical Exam Vitals and nursing note reviewed.  Constitutional:      General: She is in acute distress (mild).     Appearance: She is well-developed. She is not ill-appearing, toxic-appearing or diaphoretic.  HENT:     Head: Normocephalic and atraumatic.  Cardiovascular:     Rate and Rhythm: Normal rate and regular rhythm.     Heart  sounds: Normal heart sounds. No murmur heard.  No gallop.   Pulmonary:     Effort: Pulmonary effort is normal. No respiratory distress.     Breath sounds: Normal breath sounds. No rales.  Chest:     Chest wall: No tenderness.  Abdominal:     General: Abdomen is flat. Bowel sounds are decreased. There is no abdominal bruit.     Palpations: Abdomen is soft. There is no shifting dullness, fluid wave, hepatomegaly or splenomegaly.     Tenderness: There is abdominal tenderness in the right upper quadrant and epigastric area.  Skin:    General: Skin is warm and dry.     Capillary Refill: Capillary refill takes 2 to 3 seconds.     Coloration: Skin is not cyanotic, jaundiced, mottled or pale.     Findings: No erythema or rash.  Neurological:     General: No focal deficit present.     Mental Status: She is alert and oriented to person, place, and time.  Psychiatric:        Mood and Affect: Mood is anxious. Mood is not depressed.        Behavior: Behavior normal.      Assessment/Plan Acute calculous cholecystitis  Admit for observation IV fluids IV antibiotics NPO after MN Pain control Nausea control COVID swab ordered, not obtained yet.  Will need OR for lap chole with possible cholangiogram Will discuss with Dr. 13/03/2020 in AM    Andrey Campanile, MD 05/18/2020, 9:20 PM

## 2020-05-18 NOTE — ED Triage Notes (Signed)
The pt is c/o abd epigastric pain since last night vomiting no diarrhea  She still has her gallbladder  lmp tuesday

## 2020-05-18 NOTE — ED Provider Notes (Signed)
[facility]   CSN: [id number] Arrival date & time: 05/18/20  1457     History Chief Complaint  Patient presents with  . Abdominal Pain    Crystal Burton is a 44 y.o. female.  HPI   Patient is a 44 year old female with no known medical history who presents to the emergency department due to abdominal pain.  Patient states last night at work she began experiencing right lower quadrant pain.  She then began experiencing intractable nausea and vomiting.  She states her vomit is yellow.  Reports associated decreased appetite.  Throughout the day today her pain has migrated to the epigastric region and right upper quadrant.  Worsens with palpation.  No hematemesis.  No diarrhea.  Denies chest pain or shortness of breath.  No urinary changes.     History reviewed. No pertinent past medical history.  There are no problems to display for this patient.   History reviewed. No pertinent surgical history.   OB History   No obstetric history on file.     No family history on file.  Social History   Tobacco Use  . Smoking status: Never Smoker  . Smokeless tobacco: Never Used  Substance Use Topics  . Alcohol use: No  . Drug use: Not on file    Home Medications Prior to Admission medications   Medication Sig Start Date End Date Taking? Authorizing Provider  albuterol (PROVENTIL) (2.5 MG/3ML) 0.083% nebulizer solution Take 3 mLs (2.5 mg total) by nebulization every 4 (four) hours. 11/01/14 11/03/14  Gerhard MunchLockwood, Robert, MD  pseudoephedrine (SUDAFED) 30 MG tablet Take 1 tablet (30 mg total) by mouth 2 (two) times daily. 11/01/14   Gerhard MunchLockwood, Robert, MD    Allergies    Patient has no known allergies.  Review of Systems   Review of Systems  All other systems reviewed and are negative. Ten systems reviewed and are negative for acute change, except as noted in the HPI.    Physical Exam Updated Vital Signs Ht 5\' 2"   (1.575 m)   Wt 78.5 kg   LMP 05/14/2020   BMI 31.65 kg/m   Physical Exam Vitals and nursing note reviewed.  Constitutional:      General: She is not in acute distress.    Appearance: Normal appearance. She is well-developed. She is obese. She is not ill-appearing, toxic-appearing or diaphoretic.  HENT:     Head: Normocephalic and atraumatic.     Right Ear: External ear normal.     Left Ear: External ear normal.     Nose: Nose normal.     Mouth/Throat:     Mouth: Mucous membranes are moist.     Pharynx: Oropharynx is clear. No oropharyngeal exudate or posterior oropharyngeal erythema.  Eyes:     Extraocular Movements: Extraocular movements intact.  Cardiovascular:     Rate and Rhythm: Normal rate and regular rhythm.     Pulses: Normal pulses.     Heart sounds: Normal heart sounds. No murmur heard.  No friction rub. No gallop.   Pulmonary:     Effort: Pulmonary effort is normal. No respiratory distress.     Breath sounds: Normal breath sounds. No stridor. No wheezing, rhonchi or rales.  Abdominal:     General: Abdomen is flat.     Tenderness: There is abdominal tenderness in the right upper quadrant and epigastric area. Positive signs include Murphy's sign. Negative signs include McBurney's sign.  Musculoskeletal:  General: Normal range of motion.     Cervical back: Normal range of motion and neck supple. No tenderness.  Skin:    General: Skin is warm and dry.  Neurological:     General: No focal deficit present.     Mental Status: She is alert and oriented to person, place, and time.  Psychiatric:        Mood and Affect: Mood normal.        Behavior: Behavior normal.     ED Results / Procedures / Treatments   Labs (all labs ordered are listed, but only abnormal results are displayed) Labs Reviewed  URINALYSIS, ROUTINE W REFLEX MICROSCOPIC - Abnormal; Notable for the following components:      Result Value   Color, Urine STRAW (*)    Ketones, ur 5 (*)     Leukocytes,Ua TRACE (*)    All other components within normal limits  COMPREHENSIVE METABOLIC PANEL - Abnormal; Notable for the following components:   Glucose, Bld 144 (*)    AST 57 (*)    All other components within normal limits  CBC - Abnormal; Notable for the following components:   WBC 18.2 (*)    All other components within normal limits  RESPIRATORY PANEL BY RT PCR (FLU A&B, COVID)  LIPASE, BLOOD  HIV ANTIBODY (ROUTINE TESTING W REFLEX)  CBC  CREATININE, SERUM  COMPREHENSIVE METABOLIC PANEL  CBC  I-STAT BETA HCG BLOOD, ED ([facility], [facility], [facility] ONLY)    EKG None  Radiology CT ABDOMEN PELVIS W CONTRAST  Result Date: 05/18/2020 CLINICAL DATA:  44 year old female with right upper quadrant abdominal pain. EXAM: CT ABDOMEN AND PELVIS WITH CONTRAST TECHNIQUE: Multidetector CT imaging of the abdomen and pelvis was performed using the standard protocol following bolus administration of intravenous contrast. CONTRAST:  OMNIPAQUE IOHEXOL 300 MG/ML  SOLN COMPARISON:  None. FINDINGS: Lower chest: The visualized lung bases are clear. No intra-abdominal free air or free fluid. Hepatobiliary: Fatty infiltration of the liver. No intrahepatic biliary ductal dilatation. There is a small stone within the gallbladder. There is mild thickened appearance of the gallbladder wall or small pericholecystic fluid. Further evaluation with right upper quadrant ultrasound recommended. Pancreas: Unremarkable. No pancreatic ductal dilatation or surrounding inflammatory changes. Spleen: Normal in size without focal abnormality. Adrenals/Urinary Tract: The adrenal glands unremarkable. The kidneys, visualized ureters, and urinary bladder appear unremarkable. Stomach/Bowel: Small scattered colonic diverticula without active inflammation. There is no bowel obstruction or active inflammation. The appendix is normal. Vascular/Lymphatic: The abdominal aorta and IVC are unremarkable. No portal venous gas. There is no  adenopathy. Reproductive: The uterus is anteverted. Probable left uterine fibroid. No adnexal masses. Other: None Musculoskeletal: No acute or significant osseous findings. IMPRESSION: 1. Cholelithiasis with possible early or mild acute cholecystitis. Further evaluation with right upper quadrant ultrasound recommended. 2. Fatty liver. 3. Small scattered colonic diverticula. No bowel obstruction. Normal appendix. Electronically Signed   By: Elgie Collard M.D.   On: 05/18/2020 18:57   US Abdomen Limited RUQ (LIVER/GB)  Result Date: 05/18/2020 CLINICAL DATA:  44 year old female with right upper quadrant abdominal pain. EXAM: ULTRASOUND ABDOMEN LIMITED RIGHT UPPER QUADRANT COMPARISON:  CT abdomen pelvis dated 05/18/2020. FINDINGS: Evaluation is limited due to body habitus and overlying bowel gas. Gallbladder: There is a 3 cm stone in the gallbladder. The gallbladder wall is slightly thickened measuring 5 mm. There is probable trace pericholecystic fluid. Positive sonographic Murphy's sign reported. Common bile duct: Diameter: 3 mm Liver: There is diffuse increased liver  echogenicity most commonly seen in the setting of fatty infiltration. Superimposed inflammation or fibrosis is not excluded. Clinical correlation is recommended. Portal vein is patent on color Doppler imaging with normal direction of blood flow towards the liver. Other: None. IMPRESSION: 1. Cholelithiasis with sonographic findings of acute cholecystitis. 2. Fatty liver. Electronically Signed   By: Elgie Collard M.D.   On: 05/18/2020 20:03    Procedures Procedures (including critical care time)  Medications Ordered in ED Medications  enoxaparin (LOVENOX) injection 40 mg (has no administration in time range)  dextrose 5% in lactated ringers with KCl 20 mEq/L infusion (has no administration in time range)  piperacillin-tazobactam (ZOSYN) IVPB 3.375 g (has no administration in time range)  ketorolac (TORADOL) 30 MG/ML injection 30 mg  (has no administration in time range)    Followed by  ketorolac (TORADOL) 30 MG/ML injection 30 mg (has no administration in time range)  oxyCODONE (Oxy IR/ROXICODONE) immediate release tablet 5-10 mg (has no administration in time range)  HYDROmorphone (DILAUDID) injection 0.5-1 mg (has no administration in time range)  zolpidem (AMBIEN) tablet 5 mg (has no administration in time range)  diphenhydrAMINE (BENADRYL) 12.5 MG/5ML elixir 12.5 mg (has no administration in time range)    Or  diphenhydrAMINE (BENADRYL) injection 12.5 mg (has no administration in time range)  docusate sodium (COLACE) capsule 100 mg (has no administration in time range)  ondansetron (ZOFRAN-ODT) disintegrating tablet 4 mg (has no administration in time range)    Or  ondansetron (ZOFRAN) injection 4 mg (has no administration in time range)  prochlorperazine (COMPAZINE) tablet 10 mg (has no administration in time range)    Or  prochlorperazine (COMPAZINE) injection 5-10 mg (has no administration in time range)  sodium chloride 0.9 % bolus 1,000 mL (0 mLs Intravenous Stopped 05/18/20 1935)  morphine 2 MG/ML injection 2 mg (2 mg Intravenous Given 05/18/20 1737)  iohexol (OMNIPAQUE) 300 MG/ML solution 100 mL (100 mLs Intravenous Contrast Given 05/18/20 1831)  morphine 2 MG/ML injection 2 mg (2 mg Intravenous Given 05/18/20 1932)  morphine 2 MG/ML injection 2 mg (2 mg Intravenous Given 05/18/20 2039)  cefTRIAXone (ROCEPHIN) 2 g in sodium chloride 0.9 % 100 mL IVPB (0 g Intravenous Stopped 05/18/20 2105)    ED Course  I have reviewed the triage vital signs and the nursing notes.  Pertinent labs & imaging results that were available during my care of the patient were reviewed by me and considered in my medical decision making (see chart for details).  Clinical Course as of May 18 2301  Sat May 18, 2020  1911 IMPRESSION: 1. Cholelithiasis with possible early or mild acute cholecystitis. Further evaluation with right  upper quadrant ultrasound recommended. 2. Fatty liver. 3. Small scattered colonic diverticula. No bowel obstruction. Normal appendix.  CT ABDOMEN PELVIS W CONTRAST [LJ]  2007 1. Cholelithiasis with sonographic findings of acute cholecystitis. 2. Fatty liver.  US Abdomen Limited RUQ (LIVER/GB) [LJ]    Clinical Course User Index [LJ] Placido Sou, PA-C   MDM Rules/Calculators/A&P                          Patient is a 44 year old female who presents today with what appears to be acute cholecystitis.  Patient has had 1 day of worsening abdominal pain.  Positive Murphy sign on my exam.  CT obtained of the abdomen which shows cholelithiasis with possible early or mild acute cholecystitis.  They recommended a follow-up ultrasound which was obtained  showing cholelithiasis with sonographic findings of acute cholecystitis.  Patient was started on IV Rocephin.  Morphine for pain.  She was given a liter of IV fluids.  She was discussed with Dr. Donell Beers who is on-call for general surgery.  Patient will undergo a laparoscopic cholecystectomy tomorrow.  Note: Portions of this report may have been transcribed using voice recognition software. Every effort was made to ensure accuracy; however, inadvertent computerized transcription errors may be present.   Final Clinical Impression(s) / ED Diagnoses Final diagnoses:  RUQ abdominal pain  Acute cholecystitis   Rx / DC Orders ED Discharge Orders    None       Placido Sou, PA-C 05/18/20 2304    Virgina Norfolk, DO 05/19/20 1110

## 2020-05-19 ENCOUNTER — Encounter (HOSPITAL_COMMUNITY): Admission: EM | Disposition: A | Discharge status: Home / Self Care | Payer: Self-pay | Source: Home / Self Care

## 2020-05-19 ENCOUNTER — Observation Stay (HOSPITAL_COMMUNITY): Payer: Self-pay | Admitting: Anesthesiology

## 2020-05-19 ENCOUNTER — Observation Stay (HOSPITAL_COMMUNITY): Payer: Self-pay

## 2020-05-19 ENCOUNTER — Encounter (HOSPITAL_COMMUNITY): Payer: Self-pay

## 2020-05-19 HISTORY — PX: CHOLECYSTECTOMY: SHX55

## 2020-05-19 LAB — CBC
HCT: 39.7 % (ref 36.0–46.0)
Hemoglobin: 13.7 g/dL (ref 12.0–15.0)
MCH: 30.6 pg (ref 26.0–34.0)
MCHC: 34.5 g/dL (ref 30.0–36.0)
MCV: 88.6 fL (ref 80.0–100.0)
Platelets: 278 10*3/uL (ref 150–400)
RBC: 4.48 MIL/uL (ref 3.87–5.11)
RDW: 13.3 % (ref 11.5–15.5)
WBC: 12 10*3/uL — ABNORMAL HIGH (ref 4.0–10.5)
nRBC: 0 % (ref 0.0–0.2)

## 2020-05-19 LAB — SURGICAL PCR SCREEN
MRSA, PCR: NEGATIVE
Staphylococcus aureus: NEGATIVE

## 2020-05-19 LAB — HIV ANTIBODY (ROUTINE TESTING W REFLEX): HIV Screen 4th Generation wRfx: NONREACTIVE

## 2020-05-19 LAB — COMPREHENSIVE METABOLIC PANEL
ALT: 378 U/L — ABNORMAL HIGH (ref 0–44)
AST: 476 U/L — ABNORMAL HIGH (ref 15–41)
Albumin: 3.5 g/dL (ref 3.5–5.0)
Alkaline Phosphatase: 160 U/L — ABNORMAL HIGH (ref 38–126)
Anion gap: 7 (ref 5–15)
BUN: 8 mg/dL (ref 6–20)
CO2: 25 mmol/L (ref 22–32)
Calcium: 9 mg/dL (ref 8.9–10.3)
Chloride: 105 mmol/L (ref 98–111)
Creatinine, Ser: 0.65 mg/dL (ref 0.44–1.00)
GFR, Estimated: >60 mL/min (ref >60)
Glucose, Bld: 169 mg/dL — ABNORMAL HIGH (ref 70–99)
Potassium: 3.5 mmol/L (ref 3.5–5.1)
Sodium: 137 mmol/L (ref 135–145)
Total Bilirubin: 1.2 mg/dL (ref 0.3–1.2)
Total Protein: 6.7 g/dL (ref 6.5–8.1)

## 2020-05-19 SURGERY — LAPAROSCOPIC CHOLECYSTECTOMY WITH INTRAOPERATIVE CHOLANGIOGRAM
Anesthesia: General | Site: Abdomen

## 2020-05-19 MED ORDER — BUPIVACAINE-EPINEPHRINE 0.25% -1:200000 IJ SOLN
INTRAMUSCULAR | Status: DC | PRN
  Administered 2020-05-19: 18 mL

## 2020-05-19 MED ORDER — ONDANSETRON HCL 4 MG/2ML IJ SOLN
INTRAMUSCULAR | Status: DC | PRN
  Administered 2020-05-19: 4 mg via INTRAVENOUS

## 2020-05-19 MED ORDER — 0.9 % SODIUM CHLORIDE (POUR BTL) OPTIME
TOPICAL | Status: DC | PRN
  Administered 2020-05-19: 1000 mL

## 2020-05-19 MED ORDER — DEXAMETHASONE SODIUM PHOSPHATE 10 MG/ML IJ SOLN
INTRAMUSCULAR | Status: AC
  Filled 2020-05-19: qty 1

## 2020-05-19 MED ORDER — CHLORHEXIDINE GLUCONATE 0.12 % MT SOLN
OROMUCOSAL | Status: AC
  Administered 2020-05-19: 15 mL via OROMUCOSAL
  Filled 2020-05-19: qty 15

## 2020-05-19 MED ORDER — BUPIVACAINE-EPINEPHRINE (PF) 0.25% -1:200000 IJ SOLN
INTRAMUSCULAR | Status: AC
  Filled 2020-05-19: qty 30

## 2020-05-19 MED ORDER — DEXAMETHASONE SODIUM PHOSPHATE 10 MG/ML IJ SOLN
INTRAMUSCULAR | Status: DC | PRN
  Administered 2020-05-19: 10 mg via INTRAVENOUS

## 2020-05-19 MED ORDER — SODIUM CHLORIDE 0.9 % IV SOLN
INTRAVENOUS | Status: DC | PRN
  Administered 2020-05-19: 15 mL

## 2020-05-19 MED ORDER — FENTANYL CITRATE (PF) 100 MCG/2ML IJ SOLN
25.0000 ug | INTRAMUSCULAR | Status: DC | PRN
  Administered 2020-05-19 (×2): 50 ug via INTRAVENOUS

## 2020-05-19 MED ORDER — CEFAZOLIN SODIUM 1 G IJ SOLR
INTRAMUSCULAR | Status: AC
  Filled 2020-05-19: qty 20

## 2020-05-19 MED ORDER — LACTATED RINGERS IV SOLN: INTRAVENOUS | Status: DC

## 2020-05-19 MED ORDER — PROPOFOL 10 MG/ML IV BOLUS
INTRAVENOUS | Status: DC | PRN
  Administered 2020-05-19: 200 mg via INTRAVENOUS

## 2020-05-19 MED ORDER — ORAL CARE MOUTH RINSE: 15.0000 mL | Freq: Once | OROMUCOSAL | Status: AC

## 2020-05-19 MED ORDER — ROCURONIUM BROMIDE 10 MG/ML (PF) SYRINGE
PREFILLED_SYRINGE | INTRAVENOUS | Status: DC | PRN
  Administered 2020-05-19: 40 mg via INTRAVENOUS

## 2020-05-19 MED ORDER — LIDOCAINE 2% (20 MG/ML) 5 ML SYRINGE
INTRAMUSCULAR | Status: AC
  Filled 2020-05-19: qty 5

## 2020-05-19 MED ORDER — FENTANYL CITRATE (PF) 250 MCG/5ML IJ SOLN
INTRAMUSCULAR | Status: AC
  Filled 2020-05-19: qty 5

## 2020-05-19 MED ORDER — SUGAMMADEX SODIUM 200 MG/2ML IV SOLN
INTRAVENOUS | Status: DC | PRN
  Administered 2020-05-19: 190 mg via INTRAVENOUS

## 2020-05-19 MED ORDER — SODIUM CHLORIDE 0.9 % IR SOLN
Status: DC | PRN
  Administered 2020-05-19: 1000 mL

## 2020-05-19 MED ORDER — FENTANYL CITRATE (PF) 250 MCG/5ML IJ SOLN
INTRAMUSCULAR | Status: DC | PRN
  Administered 2020-05-19: 100 ug via INTRAVENOUS
  Administered 2020-05-19 (×2): 50 ug via INTRAVENOUS

## 2020-05-19 MED ORDER — MIDAZOLAM HCL 2 MG/2ML IJ SOLN
INTRAMUSCULAR | Status: AC
  Filled 2020-05-19: qty 2

## 2020-05-19 MED ORDER — LIDOCAINE 2% (20 MG/ML) 5 ML SYRINGE
INTRAMUSCULAR | Status: DC | PRN
  Administered 2020-05-19: 50 mg via INTRAVENOUS

## 2020-05-19 MED ORDER — ONDANSETRON HCL 4 MG/2ML IJ SOLN
INTRAMUSCULAR | Status: AC
  Filled 2020-05-19: qty 2

## 2020-05-19 MED ORDER — PROPOFOL 10 MG/ML IV BOLUS
INTRAVENOUS | Status: AC
  Filled 2020-05-19: qty 20

## 2020-05-19 MED ORDER — DEXMEDETOMIDINE (PRECEDEX) IN NS 20 MCG/5ML (4 MCG/ML) IV SYRINGE
PREFILLED_SYRINGE | INTRAVENOUS | Status: AC
  Filled 2020-05-19: qty 5

## 2020-05-19 MED ORDER — HEMOSTATIC AGENTS (NO CHARGE) OPTIME
TOPICAL | Status: DC | PRN
  Administered 2020-05-19: 1 via TOPICAL

## 2020-05-19 MED ORDER — FENTANYL CITRATE (PF) 100 MCG/2ML IJ SOLN
INTRAMUSCULAR | Status: AC
  Filled 2020-05-19: qty 2

## 2020-05-19 MED ORDER — CHLORHEXIDINE GLUCONATE 0.12 % MT SOLN: 15.0000 mL | Freq: Once | OROMUCOSAL | Status: AC

## 2020-05-19 MED ORDER — SUCCINYLCHOLINE CHLORIDE 20 MG/ML IJ SOLN
INTRAMUSCULAR | Status: DC | PRN
  Administered 2020-05-19: 140 mg via INTRAVENOUS

## 2020-05-19 MED ORDER — MIDAZOLAM HCL 5 MG/5ML IJ SOLN
INTRAMUSCULAR | Status: DC | PRN
  Administered 2020-05-19: 2 mg via INTRAVENOUS

## 2020-05-19 MED ORDER — ENOXAPARIN SODIUM 40 MG/0.4ML ~~LOC~~ SOLN
40.0000 mg | SUBCUTANEOUS | Status: DC
  Administered 2020-05-20 – 2020-05-21 (×2): 40 mg via SUBCUTANEOUS
  Filled 2020-05-19 (×2): qty 0.4

## 2020-05-19 MED ORDER — DEXMEDETOMIDINE (PRECEDEX) IN NS 20 MCG/5ML (4 MCG/ML) IV SYRINGE
PREFILLED_SYRINGE | INTRAVENOUS | Status: DC | PRN
  Administered 2020-05-19: 4 ug via INTRAVENOUS
  Administered 2020-05-19: 8 ug via INTRAVENOUS
  Administered 2020-05-19: 4 ug via INTRAVENOUS

## 2020-05-19 SURGICAL SUPPLY — 47 items
APPLIER CLIP 5 13 M/L LIGAMAX5 (MISCELLANEOUS) ×3
BLADE CLIPPER SURG (BLADE) IMPLANT
CANISTER SUCT 3000ML PPV (MISCELLANEOUS) ×3 IMPLANT
CHLORAPREP W/TINT 26 (MISCELLANEOUS) ×3 IMPLANT
CLIP APPLIE 5 13 M/L LIGAMAX5 (MISCELLANEOUS) ×1 IMPLANT
CLOSURE WOUND 1/2 X4 (GAUZE/BANDAGES/DRESSINGS)
COVER MAYO STAND STRL (DRAPES) ×3 IMPLANT
COVER SURGICAL LIGHT HANDLE (MISCELLANEOUS) ×3 IMPLANT
COVER WAND RF STERILE (DRAPES) ×3 IMPLANT
DERMABOND ADVANCED (GAUZE/BANDAGES/DRESSINGS) ×2
DERMABOND ADVANCED .7 DNX12 (GAUZE/BANDAGES/DRESSINGS) ×1 IMPLANT
DRAPE C-ARM 42X120 X-RAY (DRAPES) ×3 IMPLANT
DRSG TEGADERM 2-3/8X2-3/4 SM (GAUZE/BANDAGES/DRESSINGS) ×9 IMPLANT
DRSG TEGADERM 4X4.75 (GAUZE/BANDAGES/DRESSINGS) IMPLANT
ELECT REM PT RETURN 9FT ADLT (ELECTROSURGICAL) ×3
ELECTRODE REM PT RTRN 9FT ADLT (ELECTROSURGICAL) ×1 IMPLANT
GAUZE SPONGE 2X2 8PLY STRL LF (GAUZE/BANDAGES/DRESSINGS) IMPLANT
GLOVE BIOGEL M STRL SZ7.5 (GLOVE) ×3 IMPLANT
GLOVE INDICATOR 8.0 STRL GRN (GLOVE) ×6 IMPLANT
GOWN STRL REUS W/ TWL LRG LVL3 (GOWN DISPOSABLE) ×2 IMPLANT
GOWN STRL REUS W/TWL 2XL LVL3 (GOWN DISPOSABLE) ×3 IMPLANT
GOWN STRL REUS W/TWL LRG LVL3 (GOWN DISPOSABLE) ×6
GRASPER SUT TROCAR 14GX15 (MISCELLANEOUS) ×3 IMPLANT
HEMOSTAT SNOW SURGICEL 2X4 (HEMOSTASIS) ×3 IMPLANT
KIT BASIN OR (CUSTOM PROCEDURE TRAY) ×3 IMPLANT
KIT TURNOVER KIT B (KITS) ×3 IMPLANT
NS IRRIG 1000ML POUR BTL (IV SOLUTION) ×3 IMPLANT
PAD ARMBOARD 7.5X6 YLW CONV (MISCELLANEOUS) ×6 IMPLANT
POUCH RETRIEVAL ECOSAC 10 (ENDOMECHANICALS) ×1 IMPLANT
POUCH RETRIEVAL ECOSAC 10MM (ENDOMECHANICALS) ×3
SCISSORS LAP 5X35 DISP (ENDOMECHANICALS) ×3 IMPLANT
SET CHOLANGIOGRAPH 5 50 .035 (SET/KITS/TRAYS/PACK) ×3 IMPLANT
SET IRRIG TUBING LAPAROSCOPIC (IRRIGATION / IRRIGATOR) ×3 IMPLANT
SET TUBE SMOKE EVAC HIGH FLOW (TUBING) ×3 IMPLANT
SLEEVE ENDOPATH XCEL 5M (ENDOMECHANICALS) ×6 IMPLANT
SPECIMEN JAR SMALL (MISCELLANEOUS) ×3 IMPLANT
SPONGE GAUZE 2X2 STER 10/PKG (GAUZE/BANDAGES/DRESSINGS)
STRIP CLOSURE SKIN 1/2X4 (GAUZE/BANDAGES/DRESSINGS) IMPLANT
SUT MNCRL AB 4-0 PS2 18 (SUTURE) ×3 IMPLANT
SUT VIC AB 0 UR5 27 (SUTURE) ×3 IMPLANT
SUT VICRYL 0 UR6 27IN ABS (SUTURE) IMPLANT
TOWEL GREEN STERILE (TOWEL DISPOSABLE) ×3 IMPLANT
TOWEL GREEN STERILE FF (TOWEL DISPOSABLE) ×3 IMPLANT
TRAY LAPAROSCOPIC MC (CUSTOM PROCEDURE TRAY) ×3 IMPLANT
TROCAR XCEL BLUNT TIP 100MML (ENDOMECHANICALS) ×3 IMPLANT
TROCAR XCEL NON-BLD 5MMX100MML (ENDOMECHANICALS) ×3 IMPLANT
WATER STERILE IRR 1000ML POUR (IV SOLUTION) ×3 IMPLANT

## 2020-05-19 NOTE — Plan of Care (Signed)

## 2020-05-19 NOTE — Progress Notes (Signed)
Information obtained from the pt via the [employer] line- Violet [phone]. Awaiting the surgeon and anesthesiologist.

## 2020-05-19 NOTE — Op Note (Addendum)
Laparoscopic Cholecystectomy with IOC Procedure Note  Indications: This patient presents with acute cholecystitis  Pre-operative Diagnosis: Acute cholecystitis  Post-operative Diagnosis: Acute cholecystitis  Surgeon: Gaynelle Adu, MD  Assistants: Lannette Donath, MD  Anesthesia: General endotracheal anesthesia  ASA Class: 2  Procedure Details  The patient was seen again in the Holding Room. The risks, benefits, complications, treatment options, and expected outcomes were discussed with the patient. The possibilities of reaction to medication, pulmonary aspiration, perforation of viscus, bleeding, recurrent infection, finding a normal gallbladder, the need for additional procedures, failure to diagnose a condition, the possible need to convert to an open procedure, and creating a complication requiring transfusion or operation were discussed with the patient. The likelihood of improving the patient's symptoms with return to their baseline status is good.  The patient and/or family concurred with the proposed plan, giving informed consent. The site of surgery properly noted. The patient was taken to Operating Room, identified as Crystal Burton and the procedure verified as Laparoscopic Cholecystectomy with Intraoperative Cholangiogram. A Time Out was held and the above information confirmed.  Prior to the induction of general anesthesia, antibiotic prophylaxis was administered. General endotracheal anesthesia was then administered and tolerated well. After the induction, the abdomen was prepped with Chloraprep and draped in the sterile fashion. The patient was positioned in the supine position.  A 5-mm incision was placed in the left upper quadrant and an optical trocar was passed into the peritoneal cavity under direct vision.  Pneumoperitoneum was then created with CO2 and tolerated well without any adverse changes in the patient's vital signs. One 5-mm port was placed in the  supraumbilical space and two 5-mm ports were placed in the right upper quadrant. The left upper quadrant port was up-sized to an 11-mm trocar.  We positioned the patient in reverse Trendelenburg, tilted slightly to the patient's left.  The gallbladder was identified, the fundus grasped and retracted cephalad. Adhesions were lysed bluntly and with the electrocautery where indicated, taking care not to injure any adjacent organs or viscus. The infundibulum was grasped and retracted laterally, exposing the peritoneum overlying the triangle of Calot. This was then divided and exposed in a blunt fashion with the help of electrocautery when appropriate. A critical view of the cystic duct and cystic artery was obtained.  The cystic duct was clearly identified and bluntly dissected circumferentially. The cystic duct was ligated with a clip distally.   An incision was made in the cystic duct and the cholangiogram catheter introduced. The catheter was secured using a clip. A cholangiogram was then obtained which showed good visualization of the distal and proximal biliary tree with no sign of filling defects or obstruction.  Contrast flowed easily into the duodenum. The catheter was then removed.   The cystic duct was then ligated with clips and divided. The cystic artery which had already been identified & dissected free, ligated with clips and divided as well.   The gallbladder was dissected from the liver bed in retrograde fashion with the electrocautery. The gallbladder was removed and placed in an Ecco sac. The liver bed was irrigated and inspected. Hemostasis was achieved with the electrocautery. A piece of hemostatic Surgicell (snow) was placed in the cystic fossa to aid in hemostasis.  The gallbladder and Ecco sac were then removed through the left upper quadrant site.  The fascia at the site was closed with a single 0 Vicryl suture with the aid of a laparoscopic suture passing device.   We again inspected  the  right upper quadrant for hemostasis.  Pneumoperitoneum was released as we removed the trocars.  4-0 Monocryl was used to close the skin.   Surgical glue was applied. The patient was then extubated and brought to the recovery room in stable condition. Instrument, sponge, and needle counts were correct at closure and at the conclusion of the case.   Findings: Cholecystitis with Cholelithiasis  Estimated Blood Loss:  20 ml         Drains: None         Specimens: Gallbladder           Complications: None; patient tolerated the procedure well.         Disposition: PACU - hemodynamically stable.         Condition: stable  I was present & scrubbed for all portions of the surgery except for skin closure and was otherwise immediately available.  I have reviewed, edited as needed and agree with the operative note as documented by the resident.  Mary Sella. Andrey Campanile, MD, FACS General, Bariatric, & Minimally Invasive Surgery [facility], [place]

## 2020-05-19 NOTE — Interval H&P Note (Signed)
History and Physical Interval Note:  05/19/2020 10:12 AM  Crystal Burton  has presented today for surgery, with the diagnosis of CHOLECYSTITIS.  The various methods of treatment have been discussed with the patient and family. After consideration of risks, benefits and other options for treatment, the patient has consented to  Procedure(s): LAPAROSCOPIC CHOLECYSTECTOMY WITH INTRAOPERATIVE CHOLANGIOGRAM (N/A) as a surgical intervention.  The patient's history has been reviewed, patient examined, no change in status, stable for surgery.  I have reviewed the patient's chart and labs.  Questions were answered to the patient's satisfaction.    Pt with severe obesity RUQ TTP  I believe the patient's symptoms are consistent with gallbladder disease.  Via the video spanish interpreter--  We discussed gallbladder disease. The patient was given Agricultural engineer. We discussed non-operative and operative management. We discussed the signs & symptoms of acute cholecystitis  I discussed laparoscopic cholecystectomy with IOC in detail.  The patient was shown diagrams detailing the procedure.  We discussed the risks and benefits of a laparoscopic cholecystectomy including, but not limited to bleeding, infection, injury to surrounding structures such as the intestine or liver, bile leak, retained gallstones, need to convert to an open procedure, prolonged diarrhea, blood clots such as  DVT, common bile duct injury, anesthesia risks, and possible need for additional procedures.  We discussed the typical post-operative recovery course. I explained that the likelihood of improvement of their symptoms is good.  I also discussed the aftercare, pain control afterwards  I also discussed that a surgery resident would be assisting me and performing key portions of her surgery and that I would be present and supervising the entire case except for skin closure  Mary Sella. Andrey Campanile, MD, FACS General,  Bariatric, & Minimally Invasive Surgery [facility] [place]   Gaynelle Adu

## 2020-05-19 NOTE — Anesthesia Postprocedure Evaluation (Signed)
Anesthesia Post Note  Patient: [name]  Procedure(s) Performed: LAPAROSCOPIC CHOLECYSTECTOMY WITH INTRAOPERATIVE CHOLANGIOGRAM (N/A Abdomen)     Patient location during evaluation: PACU Anesthesia Type: General Level of consciousness: awake Pain management: pain level controlled Vital Signs Assessment: post-procedure vital signs reviewed and stable Respiratory status: spontaneous breathing Cardiovascular status: stable Postop Assessment: no apparent nausea or vomiting Anesthetic complications: no   No complications documented.  Last Vitals:  Vitals:   05/19/20 1235 05/19/20 1250  BP: (!) 143/71 133/83  Pulse: 68 69  Resp: 18 12  Temp:    SpO2: 99% 94%    Last Pain:  Vitals:   05/19/20 1250  TempSrc:   PainSc: 3                  Kinslea Frances

## 2020-05-19 NOTE — Plan of Care (Signed)
  Problem: Pain Managment: Goal: General experience of comfort will improve Outcome: Progressing   Problem: Safety: Goal: Ability to remain free from injury will improve Outcome: Progressing   Problem: Skin Integrity: Goal: Risk for impaired skin integrity will decrease Outcome: Progressing   

## 2020-05-19 NOTE — Anesthesia Preprocedure Evaluation (Addendum)
Anesthesia Evaluation  Patient identified by MRN, date of birth, ID band Patient awake    Reviewed: Allergy & Precautions, NPO status   Airway Mallampati: II  TM Distance: >3 FB     Dental   Pulmonary neg pulmonary ROS,    breath sounds clear to auscultation       Cardiovascular negative cardio ROS   Rhythm:Regular Rate:Normal     Neuro/Psych negative neurological ROS     GI/Hepatic Neg liver ROS, History noted. CG   Endo/Other  negative endocrine ROS  Renal/GU negative Renal ROS     Musculoskeletal   Abdominal   Peds  Hematology   Anesthesia Other Findings   Reproductive/Obstetrics                             Anesthesia Physical Anesthesia Plan  ASA: II  Anesthesia Plan: General   Post-op Pain Management:    Induction: Intravenous  PONV Risk Score and Plan: Ondansetron, Dexamethasone and Midazolam  Airway Management Planned: Oral ETT  Additional Equipment:   Intra-op Plan:   Post-operative Plan: Possible Post-op intubation/ventilation  Informed Consent: I have reviewed the patients History and Physical, chart, labs and discussed the procedure including the risks, benefits and alternatives for the proposed anesthesia with the patient or authorized representative who has indicated his/her understanding and acceptance.     Dental advisory given  Plan Discussed with: CRNA and Anesthesiologist  Anesthesia Plan Comments:         Anesthesia Quick Evaluation

## 2020-05-19 NOTE — Anesthesia Procedure Notes (Signed)
Procedure Name: Intubation Date/Time: 05/19/2020 10:35 AM Performed by: Marena Chancy, CRNA Pre-anesthesia Checklist: Patient identified, Emergency Drugs available, Suction available and Patient being monitored Patient Re-evaluated:Patient Re-evaluated prior to induction Oxygen Delivery Method: Circle System Utilized Preoxygenation: Pre-oxygenation with 100% oxygen Induction Type: IV induction, Cricoid Pressure applied and Rapid sequence Laryngoscope Size: Miller and 2 Grade View: Grade II Tube type: Oral Tube size: 7.0 mm Number of attempts: 1 Airway Equipment and Method: Stylet and Oral airway Placement Confirmation: ETT inserted through vocal cords under direct vision,  positive ETCO2 and breath sounds checked- equal and bilateral Tube secured with: Tape Dental Injury: Teeth and Oropharynx as per pre-operative assessment

## 2020-05-20 ENCOUNTER — Encounter (HOSPITAL_COMMUNITY): Payer: Self-pay | Admitting: General Surgery

## 2020-05-20 MED ORDER — SIMETHICONE 80 MG PO CHEW
80.0000 mg | CHEWABLE_TABLET | Freq: Four times a day (QID) | ORAL | Status: DC
  Administered 2020-05-20 – 2020-05-21 (×5): 80 mg via ORAL
  Filled 2020-05-20 (×4): qty 1

## 2020-05-20 MED ORDER — TRAMADOL HCL 50 MG PO TABS
50.0000 mg | ORAL_TABLET | Freq: Four times a day (QID) | ORAL | Status: DC | PRN
  Administered 2020-05-20 – 2020-05-21 (×3): 100 mg via ORAL
  Filled 2020-05-20 (×3): qty 2

## 2020-05-20 MED ORDER — NAPHAZOLINE-GLYCERIN 0.012-0.2 % OP SOLN
1.0000 [drp] | Freq: Four times a day (QID) | OPHTHALMIC | Status: DC | PRN
  Administered 2020-05-20: 1 [drp] via OPHTHALMIC
  Filled 2020-05-20: qty 15

## 2020-05-20 MED ORDER — DIAZEPAM 5 MG PO TABS
5.0000 mg | ORAL_TABLET | Freq: Four times a day (QID) | ORAL | Status: DC | PRN
  Administered 2020-05-20: 5 mg via ORAL
  Filled 2020-05-20: qty 1

## 2020-05-20 NOTE — Anesthesia Postprocedure Evaluation (Signed)
Anesthesia Post Note  Patient: [name]  Procedure(s) Performed: LAPAROSCOPIC CHOLECYSTECTOMY WITH INTRAOPERATIVE CHOLANGIOGRAM (N/A Abdomen)     Patient location during evaluation: PACU Anesthesia Type: General Level of consciousness: awake Pain management: pain level controlled Vital Signs Assessment: post-procedure vital signs reviewed and stable Respiratory status: spontaneous breathing Postop Assessment: no apparent nausea or vomiting Anesthetic complications: no   No complications documented.  Last Vitals:  Vitals:   05/20/20 0735 05/20/20 1954  BP: 99/60 126/82  Pulse: 72 72  Resp: 16 17  Temp: 37 C 36.7 C  SpO2: 94% 96%    Last Pain:  Vitals:   05/20/20 1954  TempSrc: Oral  PainSc:                  Crystal Burton

## 2020-05-20 NOTE — Discharge Instructions (Signed)
CIRUGIA LAPAROSCOPICA: INSTRUCCIONES DE POST OPERATORIO.  Revise siempre los documentos que le entreguen en el lugar donde se ha hecho la cirugia.  SI USTED NECESITA DOCUMENTOS DE INCAPACIDAD (DISABLE) O DE PERMISO FAMILAR (FAMILY LEAVE) NECESITA TRAERLOS A LA OFICINA PARA QUE SEAN PROCESADOS. NO  SE LOS DE A SU DOCTOR. 1. A su alta del hospital se le dara una receta para controlar el dolor. Tomela como ha sido recetada, si la necesita. Si no la necesita puede tomar, Acetaminofen (Tylenol) o Ibuprofen (Advil) para aliviar dolor moderado. 2. Continue tomando el resto de sus medicinas. 3. Si necesita rellenar la receta, llame a la farmacia. ellos contactan a nuestra oficina pidiendo autorizacion. Este tipo de receta no pueden ser rellenadas despues de las  5pm o durante los fines de semana. 4. Con relacion a la dieta: debe ser ligera los primeros dias despues que llege a la casa. Ejemplo: sopas y galleticas. Tome bastante liquido esos dias. 5. La mayoria de los pacientes padecen de inflamacion y cambio de coloracion de la piel alrededor de las incisiones. esto toma dias en resolver.  pnerse una bolsa de hielo en el area affectada ayuda..  6. Es comun tambien tener un poco de estrenimiento si esta tomado medicinas para el dolor. incremente la cantidad de liquidos a tomar y puede tomar (Colace) esto previene el problema. Si ya tiene estrenimiento, es decir no ha defecado en 48 horas, puede tomar un laxativo (Milk of Magnesia or Miralax) uselo como el paquete le explica. 7.  A menos que se le diga algo diferente. Remueva el bendaje a las 24-48 horas despues dela cirugia. y puede banarse en la ducha sin ningun problema. usted puede tener steri-strips (pequenas curitas transparentes en la piel puesta encima de la incision)  Estas banditas strips should be left on the skin for 7-10 days.   Si su cirujano puso pegamento encima de la incision usted puede banarse bajo la ducha en 24 horas. Este pegamento empezara a  caerse en las proximas 2-3 semanas. Si le pusieron suturas o presillas (grapos) estos seran quitados en su proxima cita en la oficina. . a. ACTIVIDADES:  Puede hacer actividad ligera.  Como caminar , subir escaleras y poco a poco irlas incrementando tanto como las tolere. Puede tener relaciones sexuales cuando sea comfortable. No carge objetos pesados o haga esfuerzos que no sean aprovados por su doctor. b. Puede manejar en cuanto no esta tomando medicamentos fuertes (narcoticos) para el dolor, pueda abrochar confortablemente el cinturon de seguridad, y pueda maniobrar y usar los pedales de su vehiculo con seguridad. c. PUEDE REGRESAR A TRABAJAR  8. Debe ver a su doctor para una cita de seguimiento en 2-3 semanas despues de la cirugia.   CUANDO LLAMAR A SU MEDICO: 1. FIEBRE mayor de  101.0 2. No produccion de orina. 3. Sangramiento continue de la herida 4. Incremento de dolor, enrojecimientio o drenaje de la herida (incision) 5. Incremento de dolor abdominal.  The clinic staff is available to answer your questions during regular business hours.  Please don't hesitate to call and ask to speak to one of the nurses for clinical concerns.  If you have a medical emergency, go to the nearest emergency room or call 911.  A surgeon from [facility] is always on call at the hospital. [address], [address], [place], [place]  [address] ? [address] [address], [place], [place]   [address] [phone] ? [phone] ? FAX [phone] Web site: [email or web address]  

## 2020-05-20 NOTE — Plan of Care (Signed)
  Problem: Activity: Goal: Risk for activity intolerance will decrease Outcome: Progressing   Problem: Coping: Goal: Level of anxiety will decrease Outcome: Progressing   Problem: Pain Managment: Goal: General experience of comfort will improve Outcome: Progressing   Problem: Safety: Goal: Ability to remain free from injury will improve Outcome: Progressing   Problem: Skin Integrity: Goal: Risk for impaired skin integrity will decrease Outcome: Progressing   

## 2020-05-20 NOTE — Progress Notes (Signed)
Progress Note  1 Day Post-Op  Subjective: Patient complains of sharp intermittent pain in LUQ. Has only been up once this AM and is not passing flatus. Denies nausea. Reports pain inhibits her from standing and the only thing that helps is the IV pain medicaton. We discussed it is ok to take this today in order to mobilize but that she needs to walk because this pain is likely gas pain. Patient in agreement with this. Also reporting some dry itchy eyes.   Objective: Vital signs in last 24 hours: Temp:  [97.8 F (36.6 C)-98.6 F (37 C)] 98.6 F (37 C) (11/15 0735) Pulse Rate:  [59-82] 72 (11/15 0735) Resp:  [12-22] 16 (11/15 0735) BP: (99-151)/(60-96) 99/60 (11/15 0735) SpO2:  [94 %-99 %] 94 % (11/15 0735) Last BM Date: 05/18/20  Intake/Output from previous day: 11/14 0701 - 11/15 0700 In: 2277.1 [I.V.:2178.3; IV Piggyback:98.8] Out: 20 [Blood:20] Intake/Output this shift: No intake/output data recorded.  PE: General: pleasant, WD, obese female who is laying in bed in NAD HEENT: Sclera are anicteric.  PERRL.  Ears and nose without any masses or lesions.  Mouth is pink and moist Heart: regular, rate, and rhythm.  Normal s1,s2. No obvious murmurs, gallops, or rubs noted.  Palpable radial and pedal pulses bilaterally Lungs: CTAB, no wheezes, rhonchi, or rales noted.  Respiratory effort nonlabored Abd: soft, ttp in LUQ, ND, unable to hear BS secondary to body habitus, incisions c/d/i     Lab Results:  Recent Labs    05/18/20 1557 05/19/20 0243  WBC 18.2* 12.0*  HGB 14.9 13.7  HCT 44.0 39.7  PLT 301 278   BMET Recent Labs    05/18/20 1557 05/19/20 0243  NA 135 137  K 4.0 3.5  CL 101 105  CO2 24 25  GLUCOSE 144* 169*  BUN 11 8  CREATININE 0.63 0.65  CALCIUM 9.3 9.0   PT/INR No results for input(s): LABPROT, INR in the last 72 hours. CMP     Component Value Date/Time   NA 137 05/19/2020 0243   K 3.5 05/19/2020 0243   CL 105 05/19/2020 0243   CO2 25  05/19/2020 0243   GLUCOSE 169 (H) 05/19/2020 0243   BUN 8 05/19/2020 0243   CREATININE 0.65 05/19/2020 0243   CALCIUM 9.0 05/19/2020 0243   PROT 6.7 05/19/2020 0243   ALBUMIN 3.5 05/19/2020 0243   AST 476 (H) 05/19/2020 0243   ALT 378 (H) 05/19/2020 0243   ALKPHOS 160 (H) 05/19/2020 0243   BILITOT 1.2 05/19/2020 0243   GFRNONAA >60 05/19/2020 0243   GFRAA >90 08/27/2011 0255   Lipase     Component Value Date/Time   LIPASE 31 05/18/2020 1557       Studies/Results: DG Cholangiogram Operative  Result Date: 05/19/2020 CLINICAL DATA:  Cholecystectomy for symptomatic cholelithiasis and cholecystitis. EXAM: INTRAOPERATIVE CHOLANGIOGRAM TECHNIQUE: Cholangiographic images from the C-arm fluoroscopic device were submitted for interpretation post-operatively. Please see the procedural report for the amount of contrast and the fluoroscopy time utilized. COMPARISON:  CT and ultrasound studies on 05/18/2020 FINDINGS: Intraoperative image obtained demonstrates normal opacified bile ducts with no filling defects. Contrast is seen in the duodenum. No contrast extravasation identified. IMPRESSION: Normal intraoperative cholangiogram. Electronically Signed   By: Irish Lack M.D.   On: 05/19/2020 12:19   CT ABDOMEN PELVIS W CONTRAST  Result Date: 05/18/2020 CLINICAL DATA:  44 year old female with right upper quadrant abdominal pain. EXAM: CT ABDOMEN AND PELVIS WITH CONTRAST TECHNIQUE: Multidetector CT  imaging of the abdomen and pelvis was performed using the standard protocol following bolus administration of intravenous contrast. CONTRAST:  OMNIPAQUE IOHEXOL 300 MG/ML  SOLN COMPARISON:  None. FINDINGS: Lower chest: The visualized lung bases are clear. No intra-abdominal free air or free fluid. Hepatobiliary: Fatty infiltration of the liver. No intrahepatic biliary ductal dilatation. There is a small stone within the gallbladder. There is mild thickened appearance of the gallbladder wall or  small pericholecystic fluid. Further evaluation with right upper quadrant ultrasound recommended. Pancreas: Unremarkable. No pancreatic ductal dilatation or surrounding inflammatory changes. Spleen: Normal in size without focal abnormality. Adrenals/Urinary Tract: The adrenal glands unremarkable. The kidneys, visualized ureters, and urinary bladder appear unremarkable. Stomach/Bowel: Small scattered colonic diverticula without active inflammation. There is no bowel obstruction or active inflammation. The appendix is normal. Vascular/Lymphatic: The abdominal aorta and IVC are unremarkable. No portal venous gas. There is no adenopathy. Reproductive: The uterus is anteverted. Probable left uterine fibroid. No adnexal masses. Other: None Musculoskeletal: No acute or significant osseous findings. IMPRESSION: 1. Cholelithiasis with possible early or mild acute cholecystitis. Further evaluation with right upper quadrant ultrasound recommended. 2. Fatty liver. 3. Small scattered colonic diverticula. No bowel obstruction. Normal appendix. Electronically Signed   By: Elgie Collard M.D.   On: 05/18/2020 18:57   US Abdomen Limited RUQ (LIVER/GB)  Result Date: 05/18/2020 CLINICAL DATA:  44 year old female with right upper quadrant abdominal pain. EXAM: ULTRASOUND ABDOMEN LIMITED RIGHT UPPER QUADRANT COMPARISON:  CT abdomen pelvis dated 05/18/2020. FINDINGS: Evaluation is limited due to body habitus and overlying bowel gas. Gallbladder: There is a 3 cm stone in the gallbladder. The gallbladder wall is slightly thickened measuring 5 mm. There is probable trace pericholecystic fluid. Positive sonographic Murphy's sign reported. Common bile duct: Diameter: 3 mm Liver: There is diffuse increased liver echogenicity most commonly seen in the setting of fatty infiltration. Superimposed inflammation or fibrosis is not excluded. Clinical correlation is recommended. Portal vein is patent on color Doppler imaging with normal  direction of blood flow towards the liver. Other: None. IMPRESSION: 1. Cholelithiasis with sonographic findings of acute cholecystitis. 2. Fatty liver. Electronically Signed   By: Elgie Collard M.D.   On: 05/18/2020 20:03    Anti-infectives: Anti-infectives (From admission, onward)   Start     Dose/Rate Route Frequency Ordered Stop   05/18/20 2315  piperacillin-tazobactam (ZOSYN) IVPB 3.375 g  Status:  Discontinued        3.375 g 12.5 mL/hr over 240 Minutes Intravenous Every 8 hours 05/18/20 2300 05/19/20 1342   05/18/20 2030  cefTRIAXone (ROCEPHIN) 2 g in sodium chloride 0.9 % 100 mL IVPB        2 g 200 mL/hr over 30 Minutes Intravenous  Once 05/18/20 2022 05/18/20 2105       Assessment/Plan Acute cholecystitis  S/p lap chole 11/14 Dr. Andrey Campanile - POD#1 - patient having gas pains - needs to ambulate - add simethicone to help dissipate gas some as well - will recheck this afternoon for possible d/c home   FEN: CLD - ok to advance as tolerated, IVF VTE: lovenox ID: rocephin 11/13; Zosyn 11/13  LOS: 0 days    Juliet Rude , [facility] 05/20/2020, 9:51 AM Please see Amion for pager number during day hours 7:00am-4:30pm

## 2020-05-21 ENCOUNTER — Encounter (HOSPITAL_COMMUNITY): Payer: Self-pay | Admitting: General Surgery

## 2020-05-21 LAB — COMPREHENSIVE METABOLIC PANEL
ALT: 186 U/L — ABNORMAL HIGH (ref 0–44)
AST: 50 U/L — ABNORMAL HIGH (ref 15–41)
Albumin: 3.1 g/dL — ABNORMAL LOW (ref 3.5–5.0)
Alkaline Phosphatase: 98 U/L (ref 38–126)
Anion gap: 7 (ref 5–15)
BUN: <5 mg/dL — ABNORMAL LOW (ref 6–20)
CO2: 27 mmol/L (ref 22–32)
Calcium: 8.6 mg/dL — ABNORMAL LOW (ref 8.9–10.3)
Chloride: 104 mmol/L (ref 98–111)
Creatinine, Ser: 0.61 mg/dL (ref 0.44–1.00)
GFR, Estimated: >60 mL/min (ref >60)
Glucose, Bld: 105 mg/dL — ABNORMAL HIGH (ref 70–99)
Potassium: 3.5 mmol/L (ref 3.5–5.1)
Sodium: 138 mmol/L (ref 135–145)
Total Bilirubin: 0.5 mg/dL (ref 0.3–1.2)
Total Protein: 6 g/dL — ABNORMAL LOW (ref 6.5–8.1)

## 2020-05-21 LAB — SURGICAL PATHOLOGY

## 2020-05-21 MED ORDER — TRAMADOL HCL 50 MG PO TABS: ORAL_TABLET | ORAL | 0 refills | Status: DC

## 2020-05-21 MED ORDER — IBUPROFEN 200 MG PO TABS
600.0000 mg | ORAL_TABLET | Freq: Four times a day (QID) | ORAL | Status: DC
  Administered 2020-05-21: 600 mg via ORAL
  Filled 2020-05-21: qty 3

## 2020-05-21 MED ORDER — ACETAMINOPHEN 500 MG PO TABS
1000.0000 mg | ORAL_TABLET | Freq: Three times a day (TID) | ORAL | Status: DC
  Administered 2020-05-21: 1000 mg via ORAL
  Filled 2020-05-21: qty 2

## 2020-05-21 MED ORDER — IBUPROFEN 200 MG PO TABS: ORAL_TABLET | ORAL | Status: AC

## 2020-05-21 MED ORDER — ACETAMINOPHEN 500 MG PO TABS: ORAL_TABLET | ORAL | 0 refills | Status: AC

## 2020-05-21 NOTE — Transfer of Care (Signed)
Immediate Anesthesia Transfer of Care Note  Patient: Crystal Burton  Procedure(s) Performed: LAPAROSCOPIC CHOLECYSTECTOMY WITH INTRAOPERATIVE CHOLANGIOGRAM (N/A Abdomen)  Patient Location: PACU  Anesthesia Type:General  Level of Consciousness: awake, alert  and oriented  Airway & Oxygen Therapy: Patient Spontanous Breathing and Patient connected to nasal cannula oxygen  Post-op Assessment: Report given to RN, Post -op Vital signs reviewed and stable and Patient moving all extremities X 4  Post vital signs: Reviewed and stable  Last Vitals:  Vitals Value Taken Time  BP 130/79 05/21/20 0309  Temp 37.1 C 05/21/20 0309  Pulse 71 05/21/20 0309  Resp 15 05/21/20 0309  SpO2 94 % 05/21/20 0309    Last Pain:  Vitals:   05/21/20 0726  TempSrc:   PainSc: 3       Patients Stated Pain Goal: 3 (05/20/20 2200)  Complications: No complications documented.

## 2020-05-21 NOTE — Discharge Summary (Signed)
Physician Discharge Summary  Patient ID: Crystal Burton MRN: [id number] DOB/AGE: 44/31/1977 44 y.o.  Admit date: 05/18/2020 Discharge date: 05/21/2020  Admission Diagnoses:  Right upper quadrant pain with acute cholecystitis  Discharge Diagnoses:  Right upper quadrant pain with acute cholecystitis Obesity/BMI 31.6  Active Problems:   Acute calculous cholecystitis   PROCEDURES: Laparoscopic cholecystectomy with intraoperative cholangiogram 05/19/2020, Dr. Doreatha Massed Course:  Crystal Burton  has presented today for surgery, with the diagnosis of CHOLECYSTITIS.  The various methods of treatment have been discussed with the patient and family. After consideration of risks, benefits and other options for treatment, the patient has consented to  Procedure(s): LAPAROSCOPIC CHOLECYSTECTOMY WITH INTRAOPERATIVE CHOLANGIOGRAM (N/A) as a surgical intervention.  The patient's history has been reviewed, patient examined, no change in status, stable for surgery.  I have reviewed the patient's chart and labs.  Questions were answered to the patient's satisfaction.   Pt with severe obesity RUQ TTP I believe the patient's symptoms are consistent with gallbladder disease. Via the video spanish interpreter-- Patient was seen in the emergency department by Dr. Donell Beers and was admitted. They discussed gallbladder disease. The patient was given educational material.  They discussed non-operative and operative management. We discussed the signs & symptoms of acute cholecystitis.  Patient was seen the following a.m. and taken the operating room by Dr. Gaynelle Adu and underwent laparoscopic cholecystectomy with intraoperative cholangiogram.  Patient tolerated the procedure well.  On the first postoperative day she had a fair amount of gas pain.  She was doing the simethicone.  She was mobilized, her diet was advanced.  On the second postoperative morning she was doing  much better ambulating without too much difficulty still pretty sore.  Her port sites all look good.  She was tolerating a soft diet.  She was only taking Dilaudid and oxycodone for pain.  We transitioned her to Tylenol and ibuprofen; with tramadol as a third possible option for pain control after discharge.  She works in [employer] and we told her no heavy lifting for 2 to 3 weeks.  She was ready for discharge in the a.m. of her second postoperative day.  Condition on discharge: Improved. CBC Latest Ref Rng & Units 05/19/2020 05/18/2020 08/27/2011  WBC 4.0 - 10.5 K/uL 12.0(H) 18.2(H) 13.7(H)  Hemoglobin 12.0 - 15.0 g/dL 95.1 88.4 16.6  Hematocrit 36 - 46 % 39.7 44.0 39.5  Platelets 150 - 400 K/uL 278 301 213   CMP Latest Ref Rng & Units 05/21/2020 05/19/2020 05/18/2020  Glucose 70 - 99 mg/dL 063(K) 160(F) 093(A)  BUN 6 - 20 mg/dL <3(F) 8 11  Creatinine 0.44 - 1.00 mg/dL 5.73 2.20 2.54  Sodium 135 - 145 mmol/L 138 137 135  Potassium 3.5 - 5.1 mmol/L 3.5 3.5 4.0  Chloride 98 - 111 mmol/L 104 105 101  CO2 22 - 32 mmol/L 27 25 24   Calcium 8.9 - 10.3 mg/dL ) 9.0 9.3  Total Protein 6.5 - 8.1 g/dL 6.0(L) 6.7 7.6  Total Bilirubin 0.3 - 1.2 mg/dL 0.5 1.2 0.7  Alkaline Phos 38 - 126 U/L 98 160(H) 119  AST 15 - 41 U/L 50(H) 476(H) 57(H)  ALT 0 - 44 U/L 186(H) 378(H) 41   CTA abdomen 05/18/2020:1. Cholelithiasis with possible early or mild acute cholecystitis. .2. Fatty liver. 3. Small scattered colonic diverticula. No bowel obstruction. Normal appendix.  Abdominal ultrasound 05/18/2020: There is a 3 cm stone in the gallbladder gallbladder slightly thickened 5 mm is  probable trace pericholecystic fluid common bile duct 3 mm.  Increased liver echogenicity commonly seen with fatty infiltration of the liver.   Disposition: Discharge disposition: 01-Home or Self Care        Allergies as of 05/21/2020   No Known Allergies     Medication List    TAKE these medications    acetaminophen 500 MG tablet Commonly known as: TYLENOL You can take 1000 mg of Tylenol every 8 hours as needed for pain.  You can alternate this with ibuprofen.  Do not take more than 4000 mg of Tylenol per day.  You can buy this over-the-counter at any drugstore.   albuterol (2.5 MG/3ML) 0.083% nebulizer solution Commonly known as: PROVENTIL Take 3 mLs (2.5 mg total) by nebulization every 4 (four) hours.   ibuprofen 200 MG tablet Commonly known as: ADVIL You can take 2-3 tablets every 6 hours as needed for pain.  You can alternate this with Tylenol/acetaminophen.  You can buy this over-the-counter at any drugstore.   pseudoephedrine 30 MG tablet Commonly known as: SUDAFED Take 1 tablet (30 mg total) by mouth 2 (two) times daily.   traMADol 50 MG tablet Commonly known as: ULTRAM You can take 1 tablet every 6 hours as needed for pain not relieved by Tylenol/acetaminophen, and ibuprofen.       Follow-up Information    Surgery, [facility]. Go on 06/11/2020.   Specialty: General Surgery Why: Cita de seguimiento programada para las 10:15 de la [place]. [place]leg[place] 30 minutos antes de la hora de la cita para English as a second language teacher. Contact information: [address] [place] [address] [phone]               Signed: Sherrie George 05/21/2020, 1:41 PM

## 2020-05-21 NOTE — Plan of Care (Signed)
  Problem: Activity: Goal: Risk for activity intolerance will decrease Outcome: Progressing   Problem: Coping: Goal: Level of anxiety will decrease Outcome: Progressing   Problem: Pain Managment: Goal: General experience of comfort will improve Outcome: Progressing   Problem: Safety: Goal: Ability to remain free from injury will improve Outcome: Progressing   Problem: Skin Integrity: Goal: Risk for impaired skin integrity will decrease Outcome: Progressing   

## 2020-05-21 NOTE — Plan of Care (Signed)
°  Problem: Health Behavior/Discharge Planning: °Goal: Ability to manage health-related needs will improve °Outcome: Adequate for Discharge °  °Problem: Activity: °Goal: Risk for activity intolerance will decrease °Outcome: Adequate for Discharge °  °Problem: Pain Managment: °Goal: General experience of comfort will improve °Outcome: Adequate for Discharge °  °

## 2020-05-21 NOTE — Progress Notes (Signed)
Pt given discharge instructions and gone over with her using interpreter, Garciella. All questions answered, pt verbalized understanding. Son on way to pick her up.

## 2020-05-21 NOTE — Progress Notes (Signed)
2 Days Post-Op    CC: Abdominal pain  Subjective: Interpreter [phone] used for translation. Patient's in the bathroom up walking.  She is tolerating the diet although she says she is not very hungry so far.  She has [employer] bag on her bedside table.  She is tolerating the pain but still having a good deal discomfort.  Port sites all look good.  Objective: Vital signs in last 24 hours: Temp:  [98.1 F (36.7 C)-98.8 F (37.1 C)] 98.8 F (37.1 C) (11/16 0309) Pulse Rate:  [71-72] 71 (11/16 0309) Resp:  [15-17] 15 (11/16 0309) BP: (126-130)/(79-82) 130/79 (11/16 0309) SpO2:  [94 %-96 %] 94 % (11/16 0309) Last BM Date: 05/18/20 Voided x1; no other intake/output recorded Afebrile vital signs are stable CMP 11/16: Potassium 3.5, glucose 105, creatinine 0.61, albumin 3.1 AST 50, ALT 186, total bilirubin 0.5 Normal intraoperative cholangiogram  Intake/Output from previous day: No intake/output data recorded. Intake/Output this shift: No intake/output data recorded.  General appearance: alert, cooperative and no distress Resp: clear to auscultation bilaterally GI: Soft, sore, port sites all look good.  Lab Results:  Recent Labs    05/18/20 1557 05/19/20 0243  WBC 18.2* 12.0*  HGB 14.9 13.7  HCT 44.0 39.7  PLT 301 278    BMET Recent Labs    05/19/20 0243 05/21/20 0424  NA 137 138  K 3.5 3.5  CL 105 104  CO2 25 27  GLUCOSE 169* 105*  BUN 8 <5*  CREATININE 0.65 0.61  CALCIUM 9.0 8.6*   PT/INR No results for input(s): LABPROT, INR in the last 72 hours.  Recent Labs  Lab 05/18/20 1557 05/19/20 0243 05/21/20 0424  AST 57* 476* 50*  ALT 41 378* 186*  ALKPHOS 119 160* 98  BILITOT 0.7 1.2 0.5  PROT 7.6 6.7 6.0*  ALBUMIN 4.2 3.5 3.1*     Lipase     Component Value Date/Time   LIPASE 31 05/18/2020 1557     Prior to Admission medications   Medication Sig Start Date End Date Taking? Authorizing Provider  albuterol (PROVENTIL) (2.5 MG/3ML) 0.083%  nebulizer solution Take 3 mLs (2.5 mg total) by nebulization every 4 (four) hours. Patient not taking: Reported on 05/20/2020 11/01/14 05/20/20  Gerhard Munch, MD  pseudoephedrine (SUDAFED) 30 MG tablet Take 1 tablet (30 mg total) by mouth 2 (two) times daily. Patient not taking: Reported on 05/20/2020 11/01/14   Gerhard Munch, MD    Medications: . docusate sodium  100 mg Oral BID  . enoxaparin (LOVENOX) injection  40 mg Subcutaneous Q24H  . simethicone  80 mg Oral QID   . dextrose 5% lactated ringers with KCl 20 mEq/L 100 mL/hr at 05/20/20 2340   Assessment/Plan   Right upper quadrant pain with acute cholecystitis Laparoscopic cholecystectomy with IOC, 05/19/2020, Dr. Gaynelle Adu, POD #2   FEN: IV fluids/regular diet ID: Rocephin preop; Zosyn x 2 doses 11/14 DVT: Lovenox Pain: Dilaudid 1 mg x 3; oxycodone 10 mg x 2; simethicone 80 mg x 4; ultrasound 100 mg x 2  Plan: I will start the patient on alternating Tylenol and ibuprofen.  I discussed pain control with the patient using primarily Tylenol and ibuprofen with oxycodone as a third pain control medication.  We discussed going back to work and showering.  Discussion and instructions were given with the assistance of the interpreter listed above.  Patient's questions were answered.  She works in a [employer].  I told her no lifting over 20 pounds for 2  to 3 weeks.  LOS: 1 day    Tinzley Dalia 05/21/2020 Please see Amion

## 2022-01-08 IMAGING — CT CT ABD-PELV W/ CM
2 of 5 series · 17 of 46 positions shown, 19 images · IV contrast (APPLIED)
Comparison: None.

CLINICAL DATA: 44-year-old female with right upper quadrant
abdominal pain.

EXAM:
CT ABDOMEN AND PELVIS WITH CONTRAST
TECHNIQUE: Multidetector CT imaging of the abdomen and pelvis was performed
using the standard protocol following bolus administration of
intravenous contrast.
CONTRAST:  100mL OMNIPAQUE IOHEXOL 300 MG/ML  SOLN

[Series 3: abd/ pelvis 5.0 i30f 2 · axial · 0.83mm/px · z∈[+1003,+1428]mm · 14 of 97 slices shown, 16 images]
[im 6/97  soft-tissue]
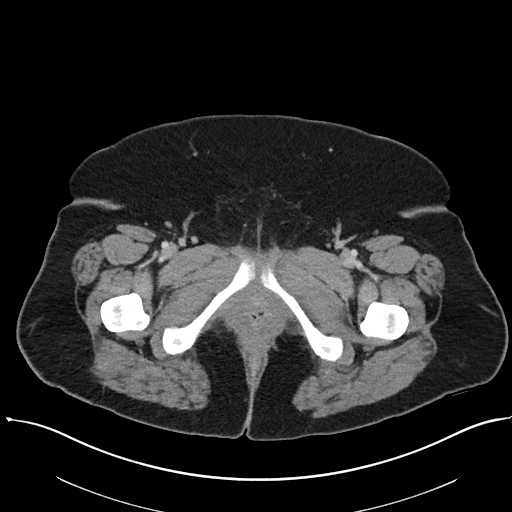
[im 6/97  bone]
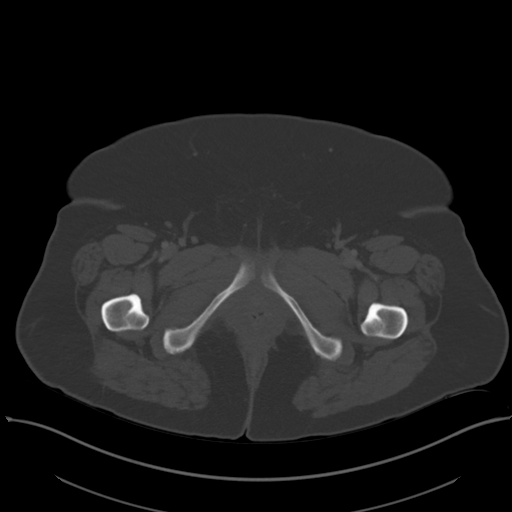
[im 11/97  soft-tissue]
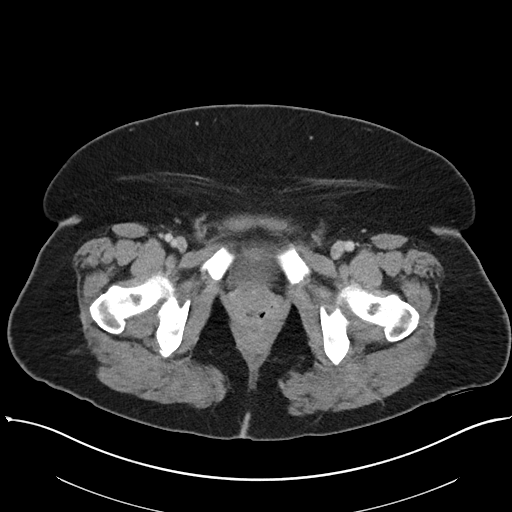
[im 21/97  soft-tissue]
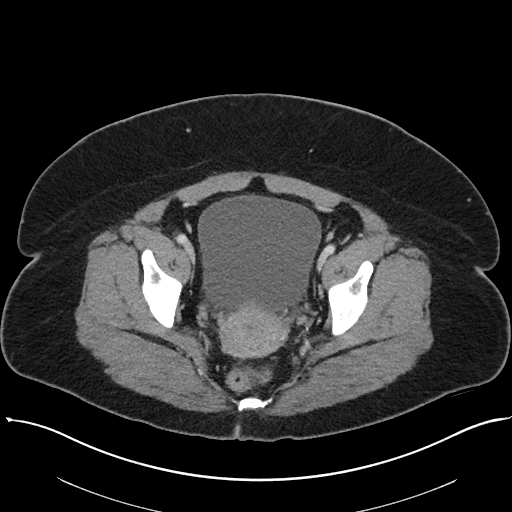
[im 26/97  soft-tissue]
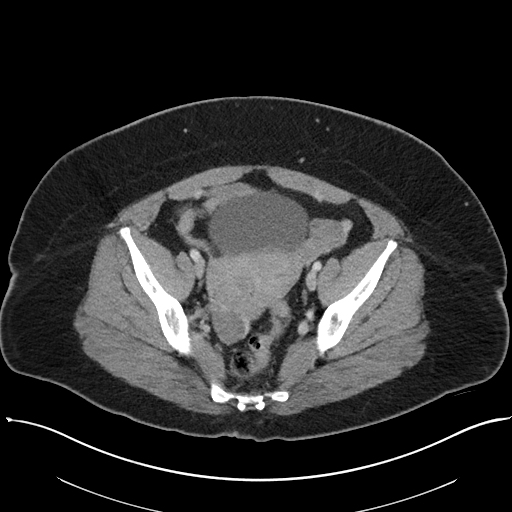
[im 31/97  soft-tissue]
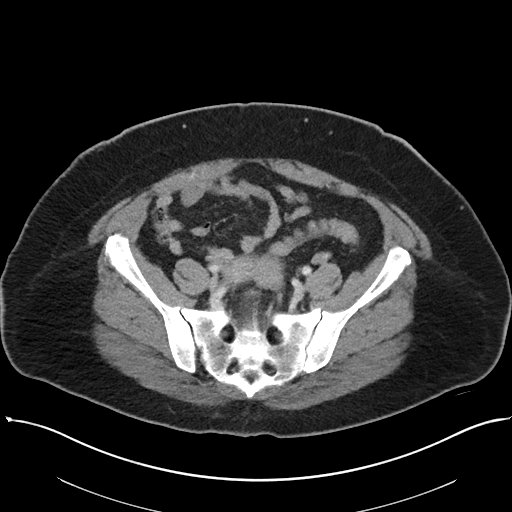
[im 41/97  soft-tissue]
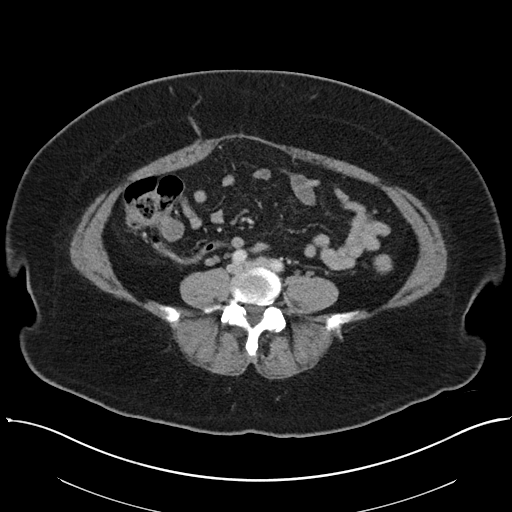
[im 46/97  soft-tissue]
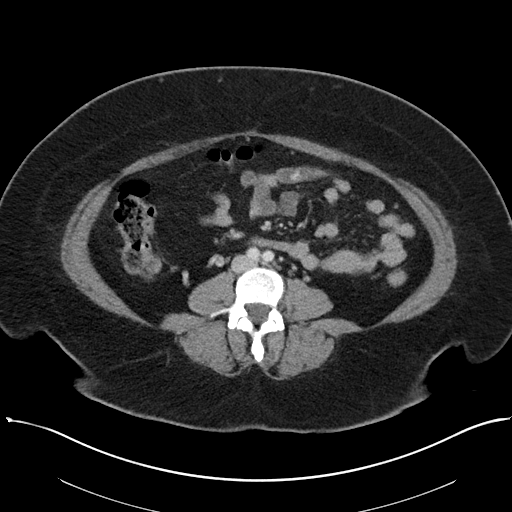
[im 51/97  soft-tissue]
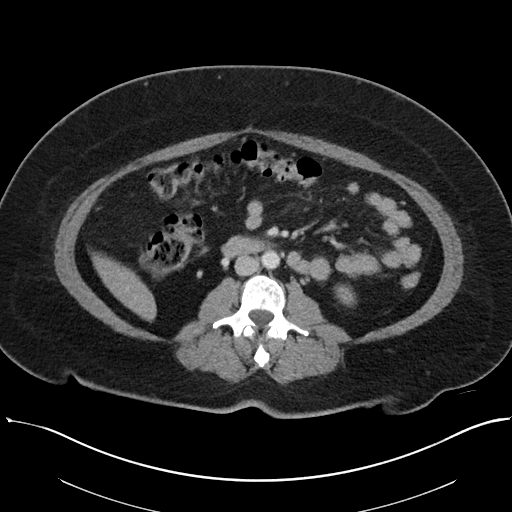
[im 56/97  soft-tissue]
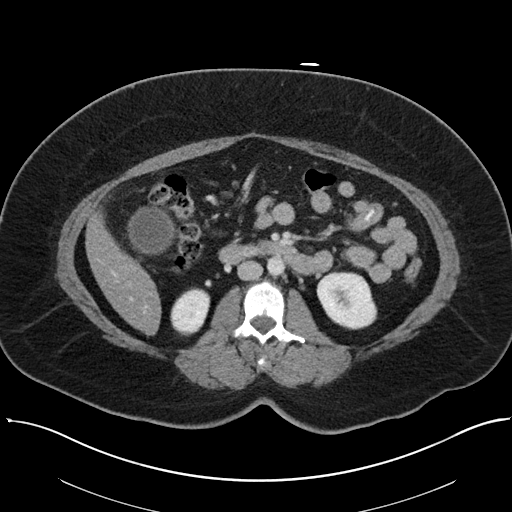
[im 56/97  bone]
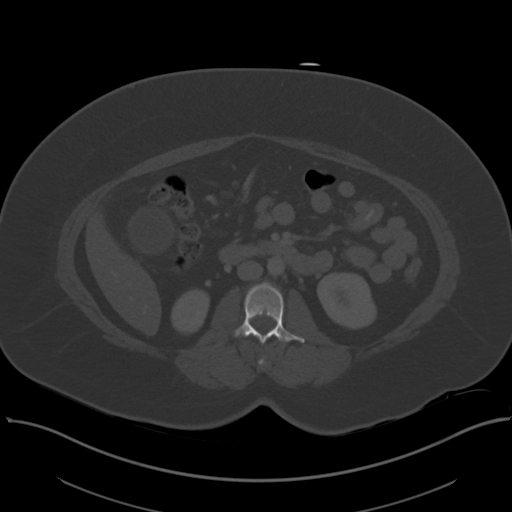
[im 66/97  soft-tissue]
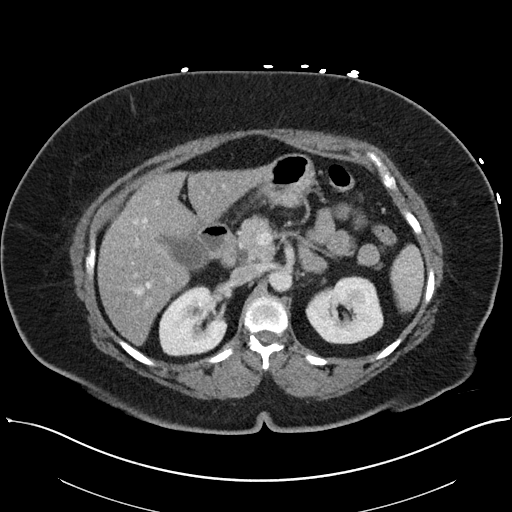
[im 71/97  soft-tissue]
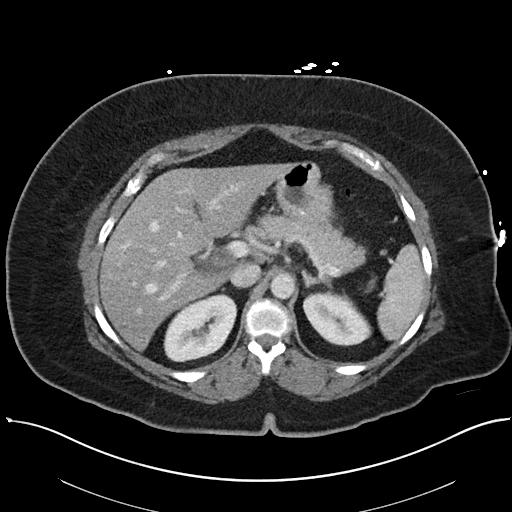
[im 76/97  soft-tissue]
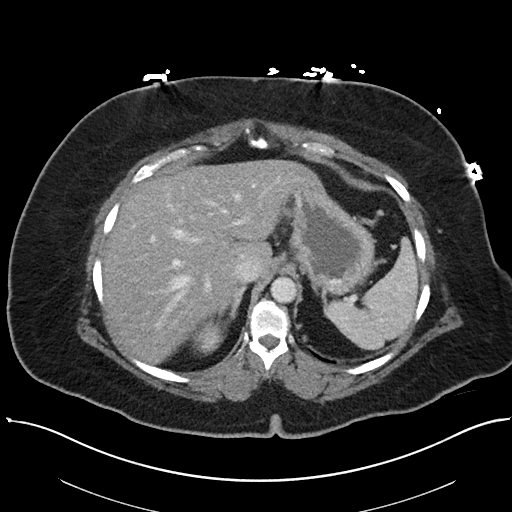
[im 86/97  soft-tissue]
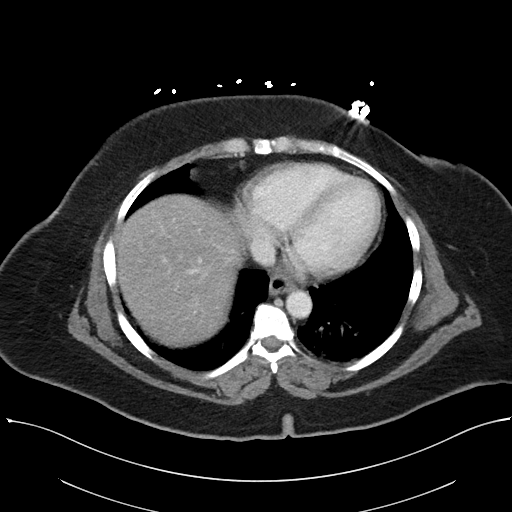
[im 91/97  soft-tissue]
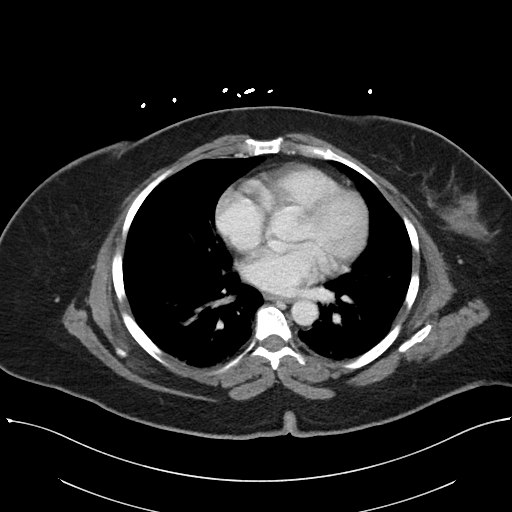

[Series 6: coronal soft tissue · coronal · 0.94mm/px · 3 of 104 slices shown]
[im 35/104  soft-tissue]
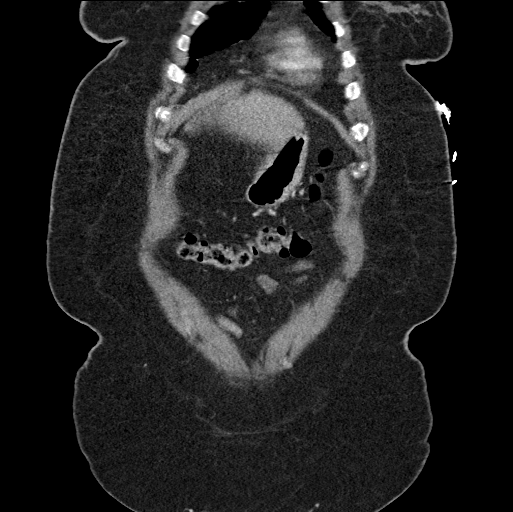
[im 46/104  soft-tissue]
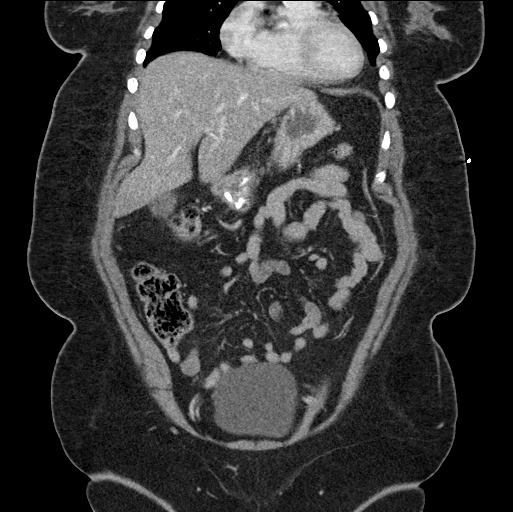
[im 58/104  soft-tissue]
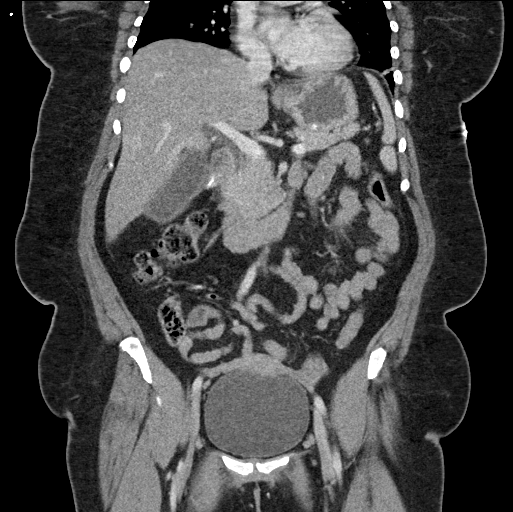

[17 of 46 positions shown; findings below may reference images not displayed]

FINDINGS: Lower chest: The visualized lung bases are clear.

No intra-abdominal free air or free fluid.

Hepatobiliary: Fatty infiltration of the liver. No intrahepatic
biliary ductal dilatation. There is a small stone within the
gallbladder. There is mild thickened appearance of the gallbladder
wall or small pericholecystic fluid. Further evaluation with right
upper quadrant ultrasound recommended.

Pancreas: Unremarkable. No pancreatic ductal dilatation or
surrounding inflammatory changes.

Spleen: Normal in size without focal abnormality.

Adrenals/Urinary Tract: The adrenal glands unremarkable. The
kidneys, visualized ureters, and urinary bladder appear
unremarkable.

Stomach/Bowel: Small scattered colonic diverticula without active
inflammation. There is no bowel obstruction or active inflammation.
The appendix is normal.

Vascular/Lymphatic: The abdominal aorta and IVC are unremarkable. No
portal venous gas. There is no adenopathy.

Reproductive: The uterus is anteverted. Probable left uterine
fibroid. No adnexal masses.

Other: None

Musculoskeletal: No acute or significant osseous findings.
IMPRESSION: 1. Cholelithiasis with possible early or mild acute cholecystitis.
Further evaluation with right upper quadrant ultrasound recommended.
2. Fatty liver.
3. Small scattered colonic diverticula. No bowel obstruction. Normal
appendix.

## 2024-02-24 ENCOUNTER — Encounter (HOSPITAL_COMMUNITY): Payer: Self-pay | Admitting: Emergency Medicine

## 2024-02-24 ENCOUNTER — Other Ambulatory Visit: Payer: Self-pay

## 2024-02-24 ENCOUNTER — Emergency Department (HOSPITAL_COMMUNITY)
Admission: EM | Admit: 2024-02-24 | Discharge: 2024-02-24 | Disposition: A | Discharge status: Home / Self Care | Payer: Self-pay | Attending: Emergency Medicine | Admitting: Emergency Medicine

## 2024-02-24 DIAGNOSIS — R42 Dizziness and giddiness: Secondary | ICD-10-CM | POA: Insufficient documentation

## 2024-02-24 DIAGNOSIS — R739 Hyperglycemia, unspecified: Secondary | ICD-10-CM | POA: Insufficient documentation

## 2024-02-24 LAB — URINALYSIS, ROUTINE W REFLEX MICROSCOPIC
Bilirubin Urine: NEGATIVE
Glucose, UA: 50 mg/dL — AB
Ketones, ur: NEGATIVE mg/dL
Nitrite: NEGATIVE
Protein, ur: 100 mg/dL — AB
Specific Gravity, Urine: 1.021 (ref 1.005–1.030)
pH: 5 (ref 5.0–8.0)

## 2024-02-24 LAB — CBC
HCT: 47 % — ABNORMAL HIGH (ref 36.0–46.0)
Hemoglobin: 16 g/dL — ABNORMAL HIGH (ref 12.0–15.0)
MCH: 30.2 pg (ref 26.0–34.0)
MCHC: 34 g/dL (ref 30.0–36.0)
MCV: 88.7 fL (ref 80.0–100.0)
Platelets: 293 K/uL (ref 150–400)
RBC: 5.3 MIL/uL — ABNORMAL HIGH (ref 3.87–5.11)
RDW: 13.4 % (ref 11.5–15.5)
WBC: 12.2 K/uL — ABNORMAL HIGH (ref 4.0–10.5)
nRBC: 0 % (ref 0.0–0.2)

## 2024-02-24 LAB — COMPREHENSIVE METABOLIC PANEL WITH GFR
ALT: 41 U/L (ref 0–44)
AST: 31 U/L (ref 15–41)
Albumin: 4 g/dL (ref 3.5–5.0)
Alkaline Phosphatase: 108 U/L (ref 38–126)
Anion gap: 13 (ref 5–15)
BUN: 10 mg/dL (ref 6–20)
CO2: 22 mmol/L (ref 22–32)
Calcium: 9.9 mg/dL (ref 8.9–10.3)
Chloride: 101 mmol/L (ref 98–111)
Creatinine, Ser: 0.57 mg/dL (ref 0.44–1.00)
GFR, Estimated: >60 mL/min (ref >60)
Glucose, Bld: 243 mg/dL — ABNORMAL HIGH (ref 70–99)
Potassium: 3.9 mmol/L (ref 3.5–5.1)
Sodium: 136 mmol/L (ref 135–145)
Total Bilirubin: 0.7 mg/dL (ref 0.0–1.2)
Total Protein: 7.6 g/dL (ref 6.5–8.1)

## 2024-02-24 LAB — CBG MONITORING, ED
Glucose-Capillary: 267 mg/dL — ABNORMAL HIGH (ref 70–99)
Glucose-Capillary: 231 mg/dL — ABNORMAL HIGH (ref 70–99)

## 2024-02-24 LAB — BETA-HYDROXYBUTYRIC ACID: Beta-Hydroxybutyric Acid: 0.3 mmol/L — ABNORMAL HIGH (ref 0.05–0.27)

## 2024-02-24 LAB — HCG, SERUM, QUALITATIVE: Preg, Serum: NEGATIVE

## 2024-02-24 MED ORDER — MECLIZINE HCL 25 MG PO TABS
12.5000 mg | ORAL_TABLET | Freq: Once | ORAL | Status: AC
  Administered 2024-02-24: 12.5 mg via ORAL
  Filled 2024-02-24: qty 1

## 2024-02-24 MED ORDER — SODIUM CHLORIDE 0.9 % IV BOLUS
1000.0000 mL | Freq: Once | INTRAVENOUS | Status: AC
  Administered 2024-02-24: 1000 mL via INTRAVENOUS

## 2024-02-24 MED ORDER — ONDANSETRON 4 MG PO TBDP: 4.0000 mg | ORAL_TABLET | Freq: Three times a day (TID) | ORAL | 0 refills | Status: AC | PRN

## 2024-02-24 MED ORDER — ONDANSETRON 4 MG PO TBDP
4.0000 mg | ORAL_TABLET | Freq: Once | ORAL | Status: AC
  Administered 2024-02-24: 4 mg via ORAL
  Filled 2024-02-24: qty 1

## 2024-02-24 MED ORDER — METFORMIN HCL 500 MG PO TABS: 500.0000 mg | ORAL_TABLET | Freq: Two times a day (BID) | ORAL | 1 refills | Status: DC

## 2024-02-24 MED ORDER — ACETAMINOPHEN 500 MG PO TABS
1000.0000 mg | ORAL_TABLET | Freq: Once | ORAL | Status: AC
  Administered 2024-02-24: 1000 mg via ORAL
  Filled 2024-02-24: qty 2

## 2024-02-24 MED ORDER — ONDANSETRON HCL 4 MG/2ML IJ SOLN
4.0000 mg | Freq: Once | INTRAMUSCULAR | Status: AC
Start: 2024-02-24 — End: 2024-02-24
  Administered 2024-02-24: 4 mg via INTRAVENOUS
  Filled 2024-02-24: qty 2

## 2024-02-24 NOTE — ED Triage Notes (Signed)
 Using interpreter, pt reports 3 days of dizziness, headache and vomiting. Pt has equal grip strengths. Denies sick contacts.

## 2024-02-24 NOTE — ED Provider Notes (Signed)
 [place] EMERGENCY DEPARTMENT AT [facility] Provider Note   CSN: [id number] Arrival date & time: 02/24/24  1058     Patient presents with: Dizziness   Crystal Burton is a 48 y.o. female.   48 year old female with prior medical history as detailed below presents for evaluation.  Patient was reported 2 to 3 days of dizziness, headache, and intermittent vomiting.  She denies fever.  She denies abdominal pain.  She denies diarrhea.    She denies known history of diabetes.  The history is provided by the patient and medical records.       Prior to Admission medications   Medication Sig Start Date End Date Taking? Authorizing Provider  acetaminophen  (TYLENOL ) 500 MG tablet You can take 1000 mg of Tylenol  every 8 hours as needed for pain.  You can alternate this with ibuprofen .  Do not take more than 4000 mg of Tylenol  per day.  You can buy this over-the-counter at any drugstore. 05/21/20   Tonnie George, PA-C  albuterol  (PROVENTIL ) (2.5 MG/3ML) 0.083% nebulizer solution Take 3 mLs (2.5 mg total) by nebulization every 4 (four) hours. Patient not taking: Reported on 05/20/2020 11/01/14 05/20/20  Garrick Charleston, MD  ibuprofen  (ADVIL ) 200 MG tablet You can take 2-3 tablets every 6 hours as needed for pain.  You can alternate this with Tylenol /acetaminophen .  You can buy this over-the-counter at any drugstore. 05/21/20   Tonnie George, PA-C  pseudoephedrine  (SUDAFED) 30 MG tablet Take 1 tablet (30 mg total) by mouth 2 (two) times daily. Patient not taking: Reported on 05/20/2020 11/01/14   Garrick Charleston, MD  traMADol  (ULTRAM ) 50 MG tablet You can take 1 tablet every 6 hours as needed for pain not relieved by Tylenol /acetaminophen , and ibuprofen . 05/21/20   Tonnie George, PA-C    Allergies: Patient has no known allergies.    Review of Systems  All other systems reviewed and are negative.   Updated Vital Signs BP (!) 154/104 (BP Location: Left  Arm)   Pulse (!) 101   Temp 98.2 F (36.8 C) (Oral)   Resp 18   Ht 5' 2 (1.575 m)   Wt 78 kg   SpO2 100%   BMI 31.45 kg/m   Physical Exam Vitals and nursing note reviewed.  Constitutional:      General: She is not in acute distress.    Appearance: Normal appearance. She is well-developed.  HENT:     Head: Normocephalic and atraumatic.  Eyes:     Conjunctiva/sclera: Conjunctivae normal.     Pupils: Pupils are equal, round, and reactive to light.  Cardiovascular:     Rate and Rhythm: Normal rate and regular rhythm.     Heart sounds: Normal heart sounds.  Pulmonary:     Effort: Pulmonary effort is normal. No respiratory distress.     Breath sounds: Normal breath sounds.  Abdominal:     General: There is no distension.     Palpations: Abdomen is soft.     Tenderness: There is no abdominal tenderness.  Musculoskeletal:        General: No deformity. Normal range of motion.     Cervical back: Normal range of motion and neck supple.  Skin:    General: Skin is warm and dry.  Neurological:     General: No focal deficit present.     Mental Status: She is alert and oriented to person, place, and time. Mental status is at baseline.     (all labs ordered are  listed, but only abnormal results are displayed) Labs Reviewed  CBC - Abnormal; Notable for the following components:      Result Value   WBC 12.2 (*)    RBC 5.30 (*)    Hemoglobin 16.0 (*)    HCT 47.0 (*)    All other components within normal limits  CBG MONITORING, ED - Abnormal; Notable for the following components:   Glucose-Capillary 267 (*)    All other components within normal limits  COMPREHENSIVE METABOLIC PANEL WITH GFR  URINALYSIS, ROUTINE W REFLEX MICROSCOPIC  HCG, SERUM, QUALITATIVE  BETA-HYDROXYBUTYRIC ACID    EKG: EKG Interpretation Date/Time:  Thursday February 24 2024 11:05:46 EDT Ventricular Rate:  93 PR Interval:  150 QRS Duration:  80 QT Interval:  354 QTC Calculation: 440 R  Axis:   32  Text Interpretation: Normal sinus rhythm Cannot rule out Anterior infarct , age undetermined Abnormal ECG When compared with ECG of 18-May-2020 17:26, PREVIOUS ECG IS PRESENT Confirmed by Laurice Coy [phone]) on 02/24/2024 11:42:37 AM  Radiology: No results found.   Procedures   Medications Ordered in the ED  sodium chloride  0.9 % bolus 1,000 mL (has no administration in time range)  ondansetron  (ZOFRAN ) injection 4 mg (has no administration in time range)                                    Medical Decision Making Patient reports dizziness.  Workup demonstrates hyperglycemia with likely undiagnosed diabetes.  With IV fluids patient feels much improved.  Patient would benefit from initiating metformin .  Patient desires discharge home.  Importance of close follow-up is stressed.  Strict return precautions given and understood.  Basics of outpatient diabetes management discussed extensively with the patient with translator utilization.  Amount and/or Complexity of Data Reviewed Labs: ordered.  Risk OTC drugs. Prescription drug management.        Final diagnoses:  Dizziness  Hyperglycemia    ED Discharge Orders          Ordered    metFORMIN  (GLUCOPHAGE ) 500 MG tablet  2 times daily with meals        02/24/24 1433               Laurice Coy BROCKS, MD 02/24/24 1435

## 2024-02-24 NOTE — Discharge Instructions (Addendum)
 Return for any problem.  ?

## 2024-02-24 NOTE — ED Notes (Signed)
 Pt given meclizine  and zofran . Pt has someone to come pick her up. Pt wheeled out in a wheelchair to wait for her ride. Denies any questions of concerns.

## 2024-05-25 ENCOUNTER — Ambulatory Visit: Payer: Self-pay | Admitting: Sports Medicine

## 2024-06-20 ENCOUNTER — Other Ambulatory Visit: Payer: Self-pay

## 2024-06-20 ENCOUNTER — Encounter: Payer: Self-pay | Admitting: Sports Medicine

## 2024-06-20 ENCOUNTER — Ambulatory Visit (INDEPENDENT_AMBULATORY_CARE_PROVIDER_SITE_OTHER): Payer: Self-pay | Admitting: Sports Medicine

## 2024-06-20 VITALS — BP 136/86 | HR 73 | Temp 98.2°F | Ht 60.0 in | Wt 195.0 lb

## 2024-06-20 DIAGNOSIS — K219 Gastro-esophageal reflux disease without esophagitis: Secondary | ICD-10-CM | POA: Diagnosis not present

## 2024-06-20 DIAGNOSIS — Z113 Encounter for screening for infections with a predominantly sexual mode of transmission: Secondary | ICD-10-CM

## 2024-06-20 DIAGNOSIS — H9202 Otalgia, left ear: Secondary | ICD-10-CM

## 2024-06-20 DIAGNOSIS — J4520 Mild intermittent asthma, uncomplicated: Secondary | ICD-10-CM

## 2024-06-20 DIAGNOSIS — Z1211 Encounter for screening for malignant neoplasm of colon: Secondary | ICD-10-CM

## 2024-06-20 DIAGNOSIS — I10 Essential (primary) hypertension: Secondary | ICD-10-CM

## 2024-06-20 DIAGNOSIS — Z1231 Encounter for screening mammogram for malignant neoplasm of breast: Secondary | ICD-10-CM

## 2024-06-20 DIAGNOSIS — Z7984 Long term (current) use of oral hypoglycemic drugs: Secondary | ICD-10-CM

## 2024-06-20 DIAGNOSIS — E119 Type 2 diabetes mellitus without complications: Secondary | ICD-10-CM

## 2024-06-20 DIAGNOSIS — Z1329 Encounter for screening for other suspected endocrine disorder: Secondary | ICD-10-CM | POA: Diagnosis not present

## 2024-06-20 DIAGNOSIS — J45909 Unspecified asthma, uncomplicated: Secondary | ICD-10-CM | POA: Insufficient documentation

## 2024-06-20 DIAGNOSIS — Z124 Encounter for screening for malignant neoplasm of cervix: Secondary | ICD-10-CM

## 2024-06-20 LAB — POCT GLYCOSYLATED HEMOGLOBIN (HGB A1C)
HbA1c POC (<> result, manual entry): 6.8 % (ref 4.0–5.6)
HbA1c, POC (controlled diabetic range): 6.8 % (ref 0.0–7.0)
HbA1c, POC (prediabetic range): 6.8 % — AB (ref 5.7–6.4)
Hemoglobin A1C: 6.8 % — AB (ref 4.0–5.6)

## 2024-06-20 MED ORDER — LISINOPRIL 10 MG PO TABS: 10.0000 mg | ORAL_TABLET | Freq: Every day | ORAL | 3 refills | Status: DC

## 2024-06-20 MED ORDER — LISINOPRIL 5 MG PO TABS: 5.0000 mg | ORAL_TABLET | Freq: Every day | ORAL | 0 refills | Status: AC

## 2024-06-20 MED ORDER — METFORMIN HCL 500 MG PO TABS: 500.0000 mg | ORAL_TABLET | Freq: Two times a day (BID) | ORAL | 0 refills | Status: AC

## 2024-06-20 MED ORDER — BLOOD GLUCOSE MONITOR KIT: PACK | 0 refills | Status: AC

## 2024-06-20 MED ORDER — CIPROFLOXACIN-DEXAMETHASONE 0.3-0.1 % OT SUSP: 4.0000 [drp] | Freq: Two times a day (BID) | OTIC | 0 refills | Status: AC

## 2024-06-20 MED ORDER — LISINOPRIL 10 MG PO TABS: 10.0000 mg | ORAL_TABLET | Freq: Every day | ORAL | 0 refills | Status: DC

## 2024-06-20 NOTE — Progress Notes (Signed)
 New Patient Office Visit  Patient ID: Crystal Burton, Female   DOB: 1976-04-09 48 y.o. MRN: [id number] Subjective:     Discussed the use of AI scribe software for clinical note transcription with the patient, who gave verbal consent to proceed.  History of Present Illness  Crystal Burton is a 48 year old female with diabetes and asthma who presents to establish care  In August, she visited the emergency room due to elevated blood sugar levels and was prescribed metformin , to be taken twice daily. However, she has been without medication for about a month due to lack of refills. Initially, she attempted to conserve her medication by taking it once daily. She does not monitor her blood sugar at home as she lacks a glucometer.  Her diet typically includes eggs for breakfast and occasionally a salad for lunch or dinner. She consumes minimal rice, white bread, and sweet bread, and avoids sweets between meals. She does not engage in regular exercise, only occasionally.  She has a history of asthma, with the last significant exacerbation during pollen season. Her last use of an albuterol  inhaler was in October. She can walk for about twenty minutes without experiencing dyspnea.  She experiences occasional heartburn and denies hematuria, hematochezia, urinary issues, or paresthesia in her feet. She has not had an eye exam in the past year. She underwent cholecystectomy in the past and has no other surgical history. She has no history of smoking or alcohol use.  Her last Pap smear was three years ago and was normal. She has never had a mammogram or colonoscopy. There is no family history of colon cancer. She denies depression, anhedonia, balance issues, vision problems, or significant arthralgia. She reports occasional pain in her knees and shoulders, which does not limit her daily activities. No dizziness or lightheadedness.   Outpatient Encounter Medications as of  06/20/2024  Medication Sig   blood glucose meter kit and supplies KIT Dispense based on patient and insurance preference. Use up to four times daily as directed.   ciprofloxacin -dexamethasone  (CIPRODEX ) OTIC suspension Place 4 drops into the left ear 2 (two) times daily.   ibuprofen  (ADVIL ) 200 MG tablet You can take 2-3 tablets every 6 hours as needed for pain.  You can alternate this with Tylenol /acetaminophen .  You can buy this over-the-counter at any drugstore.   lisinopril  (ZESTRIL ) 5 MG tablet Take 1 tablet (5 mg total) by mouth daily.   ondansetron  (ZOFRAN -ODT) 4 MG disintegrating tablet Take 1 tablet (4 mg total) by mouth every 8 (eight) hours as needed for nausea or vomiting.   [DISCONTINUED] lisinopril  (ZESTRIL ) 10 MG tablet Take 1 tablet (10 mg total) by mouth daily.   [DISCONTINUED] lisinopril  (ZESTRIL ) 10 MG tablet Take 1 tablet (10 mg total) by mouth daily.   [DISCONTINUED] metFORMIN  (GLUCOPHAGE ) 500 MG tablet Take 1 tablet (500 mg total) by mouth 2 (two) times daily with a meal.   acetaminophen  (TYLENOL ) 500 MG tablet You can take 1000 mg of Tylenol  every 8 hours as needed for pain.  You can alternate this with ibuprofen .  Do not take more than 4000 mg of Tylenol  per day.  You can buy this over-the-counter at any drugstore. (Patient not taking: Reported on 06/20/2024)   metFORMIN  (GLUCOPHAGE ) 500 MG tablet Take 1 tablet (500 mg total) by mouth 2 (two) times daily with a meal.   [DISCONTINUED] albuterol  (PROVENTIL ) (2.5 MG/3ML) 0.083% nebulizer solution Take 3 mLs (2.5 mg total) by nebulization every 4 (four) hours. (  Patient not taking: Reported on 06/20/2024)   [DISCONTINUED] pseudoephedrine  (SUDAFED) 30 MG tablet Take 1 tablet (30 mg total) by mouth 2 (two) times daily. (Patient not taking: Reported on 06/20/2024)   [DISCONTINUED] traMADol  (ULTRAM ) 50 MG tablet You can take 1 tablet every 6 hours as needed for pain not relieved by Tylenol /acetaminophen , and ibuprofen . (Patient not  taking: Reported on 06/20/2024)   No facility-administered encounter medications on file as of 06/20/2024.    Past Medical History:  Diagnosis Date   Asthma    DM (diabetes mellitus) (HCC)    GERD (gastroesophageal reflux disease)    HTN (hypertension)     Past Surgical History:  Procedure Laterality Date   CHOLECYSTECTOMY N/A 05/19/2020   Procedure: LAPAROSCOPIC CHOLECYSTECTOMY WITH INTRAOPERATIVE CHOLANGIOGRAM;  Surgeon: Tanda Locus, MD;  Location: [facility] OR;  Service: General;  Laterality: N/A;   IMPACTED THIRD MOLAR REMOVAL      History reviewed. No pertinent family history.  Social History   Socioeconomic History   Marital status: Single    Spouse name: Not on file   Number of children: Not on file   Years of education: Not on file   Highest education level: Not on file  Occupational History   Not on file  Tobacco Use   Smoking status: Never   Smokeless tobacco: Never  Vaping Use   Vaping status: Never Used  Substance and Sexual Activity   Alcohol use: No   Drug use: Never   Sexual activity: Not on file  Other Topics Concern   Not on file  Social History Narrative   Not on file   Social Drivers of Health   Tobacco Use: Low Risk (06/20/2024)   Patient History    Smoking Tobacco Use: Never    Smokeless Tobacco Use: Never    Passive Exposure: Not on file  Financial Resource Strain: Not on file  Food Insecurity: Not on file  Transportation Needs: Not on file  Physical Activity: Not on file  Stress: Not on file  Social Connections: Not on file  Intimate Partner Violence: Not on file  Depression (PHQ2-9): Low Risk (06/20/2024)   Depression (PHQ2-9)    PHQ-2 Score: 0  Alcohol Screen: Not on file  Housing: Not on file  Utilities: Not on file  Health Literacy: Not on file    Review of Systems  Constitutional:  Negative for chills and fever.  HENT:  Negative for congestion and sore throat.   Respiratory:  Negative for cough, sputum production and  shortness of breath.   Cardiovascular:  Negative for chest pain, palpitations and leg swelling.  Gastrointestinal:  Negative for abdominal pain, heartburn and nausea.  Genitourinary:  Negative for dysuria, frequency and hematuria.  Musculoskeletal:  Positive for joint pain. Negative for falls and myalgias.  Neurological:  Negative for dizziness, sensory change and focal weakness.  Psychiatric/Behavioral:  Negative for depression and suicidal ideas.      Objective:    BP 136/86   Pulse 73   Temp 98.2 F (36.8 C) (Oral)   Ht 5' (1.524 m)   Wt 195 lb (88.5 kg)   SpO2 98%   BMI 38.08 kg/m   Physical Exam Constitutional:      Appearance: Normal appearance.  HENT:     Head: Normocephalic and atraumatic.     Ears:     Comments: Left TM cloudy  No drainage  Cardiovascular:     Rate and Rhythm: Normal rate and regular rhythm.  Pulmonary:  Effort: Pulmonary effort is normal. No respiratory distress.     Breath sounds: Normal breath sounds. No wheezing.  Abdominal:     General: Bowel sounds are normal. There is no distension.     Tenderness: There is no abdominal tenderness. There is no guarding or rebound.     Comments:    Musculoskeletal:        General: No swelling or tenderness.  Neurological:     Mental Status: She is alert. Mental status is at baseline.     Sensory: No sensory deficit.     Motor: No weakness.     Last CBC Lab Results  Component Value Date   WBC 12.2 (H) 02/24/2024   HGB 16.0 (H) 02/24/2024   HCT 47.0 (H) 02/24/2024   MCV 88.7 02/24/2024   MCH 30.2 02/24/2024   RDW 13.4 02/24/2024   PLT 293 02/24/2024   Last metabolic panel Lab Results  Component Value Date   GLUCOSE 243 (H) 02/24/2024   NA 136 02/24/2024   K 3.9 02/24/2024   CL 101 02/24/2024   CO2 22 02/24/2024   BUN 10 02/24/2024   CREATININE 0.57 02/24/2024   GFRNONAA >60 02/24/2024   CALCIUM 9.9 02/24/2024   PROT 7.6 02/24/2024   ALBUMIN 4.0 02/24/2024   BILITOT 0.7 02/24/2024    ALKPHOS 108 02/24/2024   AST 31 02/24/2024   ALT 41 02/24/2024   ANIONGAP 13 02/24/2024   Last lipids No results found for: CHOL, HDL, LDLCALC, LDLDIRECT, TRIG, CHOLHDL Last hemoglobin A1c Lab Results  Component Value Date   HGBA1C 6.8 (A) 06/20/2024   HGBA1C 6.8 06/20/2024   HGBA1C 6.8 (A) 06/20/2024   HGBA1C 6.8 06/20/2024   Last thyroid functions No results found for: TSH, T3TOTAL, T4TOTAL, FREET4, THYROIDAB Last vitamin D No results found for: 25OHVITD2, 25OHVITD3, VD25OH Last vitamin B12 and Folate No results found for: VITAMINB12, FOLATE     Assessment & Plan:   Assessment & Plan Type 2 diabetes mellitus without complication, without long-term current use of insulin (HCC) A1c 6.8  Refilled metformin   Monitor bG daily and keep a log Avoid sweets Exercise regularly  Orders:   CBC with Differential/Platelet; Future   COMPLETE METABOLIC PANEL WITHOUT GFR; Future   Lipid panel; Future   Microalbumin / creatinine urine ratio; Future   metFORMIN  (GLUCOPHAGE ) 500 MG tablet; Take 1 tablet (500 mg total) by mouth 2 (two) times daily with a meal.   Ambulatory referral to Ophthalmology   POCT glycosylated hemoglobin (Hb A1C)   blood glucose meter kit and supplies KIT; Dispense based on patient and insurance preference. Use up to four times daily as directed.  Primary hypertension Will start lowdose lisinopril   Follow up in 4 weeks Orders:   CBC with Differential/Platelet; Future   COMPLETE METABOLIC PANEL WITHOUT GFR; Future   lisinopril  (ZESTRIL ) 5 MG tablet; Take 1 tablet (5 mg total) by mouth daily.  Mild intermittent asthma, unspecified whether complicated No wheezing Cont with abuterol prn    Gastroesophageal reflux disease, unspecified whether esophagitis present Avoid spicy foods Denies bloody or dark stools    Screening for thyroid disorder  Orders:   TSH; Future    Breast cancer screening by mammogram    Orders:   MM 3D SCREENING MAMMOGRAM BILATERAL BREAST; Future  Screening for STD (sexually transmitted disease)  Orders:   Hepatitis C Antibody; Future  Screening for colon cancer  Orders:   Cologuard  Screening for cervical cancer  Orders:   Ambulatory referral  to Gynecology  Left ear pain Left TM cloudy  No redness or swelling Pt c/o left ear pain since few days Denies fevers, diischarge Orders:   ciprofloxacin -dexamethasone  (CIPRODEX ) OTIC suspension; Place 4 drops into the left ear 2 (two) times daily.   Return in about 4 weeks (around 07/18/2024).   Jackalyn Blazing, MD [facility]

## 2024-06-20 NOTE — Assessment & Plan Note (Signed)
 Avoid spicy foods Denies bloody or dark stools

## 2024-06-20 NOTE — Assessment & Plan Note (Signed)
 No wheezing Cont with abuterol prn

## 2024-06-20 NOTE — Patient Instructions (Signed)

## 2024-06-26 ENCOUNTER — Other Ambulatory Visit

## 2024-07-18 ENCOUNTER — Other Ambulatory Visit: Payer: Self-pay | Admitting: Sports Medicine

## 2024-07-18 ENCOUNTER — Encounter: Payer: Self-pay | Admitting: Sports Medicine

## 2024-07-18 ENCOUNTER — Ambulatory Visit: Admitting: Sports Medicine

## 2024-07-18 ENCOUNTER — Telehealth: Payer: Self-pay

## 2024-07-18 DIAGNOSIS — I10 Essential (primary) hypertension: Secondary | ICD-10-CM

## 2024-07-18 NOTE — Telephone Encounter (Signed)
 Copied from CRM #[id number]. Topic: General - Other >> Jul 18, 2024  1:51 PM Alexandria E wrote: Reason for CRM: Adam with [employer] received a cologuard sample from the patient. He has been having difficulty trying to get a hold of her as there was a spelling discrepancy on the sample label. Crystal Burton is needing the patient to reach out to [phone].

## 2024-07-19 NOTE — Telephone Encounter (Signed)
 Called pt and left a VM to return my call. Pt ns appt on 1/13

## 2024-07-20 LAB — COLOGUARD: COLOGUARD: NEGATIVE

## 2024-07-21 ENCOUNTER — Ambulatory Visit: Payer: Self-pay | Admitting: Sports Medicine
